# Patient Record
Sex: Male | Born: 1945
Health system: Southern US, Community
[De-identification: ages and names within clinical notes are randomized; demographics above are authoritative.]

## PROBLEM LIST (undated history)

## (undated) DIAGNOSIS — T7840XA Allergy, unspecified, initial encounter: Secondary | ICD-10-CM

## (undated) DIAGNOSIS — N400 Enlarged prostate without lower urinary tract symptoms: Secondary | ICD-10-CM

## (undated) DIAGNOSIS — K219 Gastro-esophageal reflux disease without esophagitis: Secondary | ICD-10-CM

## (undated) DIAGNOSIS — H269 Unspecified cataract: Secondary | ICD-10-CM

## (undated) DIAGNOSIS — E785 Hyperlipidemia, unspecified: Secondary | ICD-10-CM

## (undated) HISTORY — DX: Hyperlipidemia, unspecified: E78.5

## (undated) HISTORY — DX: Gastro-esophageal reflux disease without esophagitis: K21.9

## (undated) HISTORY — DX: Allergy, unspecified, initial encounter: T78.40XA

## (undated) HISTORY — DX: Unspecified cataract: H26.9

## (undated) HISTORY — DX: Benign prostatic hyperplasia without lower urinary tract symptoms: N40.0

---

## 2013-08-02 ENCOUNTER — Ambulatory Visit (INDEPENDENT_AMBULATORY_CARE_PROVIDER_SITE_OTHER): Payer: PRIVATE HEALTH INSURANCE | Admitting: Family Medicine

## 2013-08-02 ENCOUNTER — Encounter (INDEPENDENT_AMBULATORY_CARE_PROVIDER_SITE_OTHER): Payer: Self-pay

## 2013-08-02 ENCOUNTER — Encounter: Payer: Self-pay | Admitting: Family Medicine

## 2013-08-02 VITALS — BP 121/74 | HR 74 | Temp 97.5°F | Ht 66.0 in | Wt 154.0 lb

## 2013-08-02 DIAGNOSIS — E559 Vitamin D deficiency, unspecified: Secondary | ICD-10-CM

## 2013-08-02 DIAGNOSIS — Z23 Encounter for immunization: Secondary | ICD-10-CM

## 2013-08-02 DIAGNOSIS — N400 Enlarged prostate without lower urinary tract symptoms: Secondary | ICD-10-CM

## 2013-08-02 DIAGNOSIS — Z0000 Encounter for general adult medical examination without abnormal findings: Secondary | ICD-10-CM

## 2013-08-02 LAB — POCT CBC
Granulocyte percent: 75.5 %G (ref 37–80)
HCT, POC: 46.7 % (ref 43.5–53.7)
Hemoglobin: 14.9 g/dL (ref 14.1–18.1)
Lymph, poc: 1.1 (ref 0.6–3.4)
MCH, POC: 30.3 pg (ref 27–31.2)
MCHC: 31.9 g/dL (ref 31.8–35.4)
MCV: 94.9 fL (ref 80–97)
MPV: 6.8 fL (ref 0–99.8)
POC Granulocyte: 4.2 (ref 2–6.9)
POC LYMPH PERCENT: 20.2 %L (ref 10–50)
Platelet Count, POC: 229 10*3/uL (ref 142–424)
RBC: 4.9 M/uL (ref 4.69–6.13)
RDW, POC: 13 %
WBC: 5.6 10*3/uL (ref 4.6–10.2)

## 2013-08-02 MED ORDER — TAMSULOSIN HCL 0.4 MG PO CAPS: 0.4000 mg | ORAL_CAPSULE | Freq: Every day | ORAL | Status: DC

## 2013-08-02 MED ORDER — FINASTERIDE 5 MG PO TABS: 5.0000 mg | ORAL_TABLET | Freq: Every day | ORAL | Status: DC

## 2013-08-02 NOTE — Patient Instructions (Addendum)
Testicular Self-Exam A self-examination of your testicles involves looking at and feeling your testicles for abnormal lumps or swelling. Several things can cause swelling, lumps, or pain in your testicles. Some of these causes are:  Injuries.  Inflammation.  Infection.  Accumulation of fluids around your testicle (hydrocele).  Twisted testicles (testicular torsion).  Testicular cancer. Self-examination of the testicles and groin areas may be advised if you are at risk for testicular cancer. Risks for testicular cancer include:  An undescended testicle (cryptorchidism).  A history of previous testicular cancer.  A family history of testicular cancer. The testicles are easiest to examine after warm baths or showers and are more difficult to examine when you are cold. This is because the muscles attached to the testicles retract and pull them up higher or into the abdomen. Follow these steps while you are standing:  Hold your penis away from your body.  Roll one testicle between your thumb and forefinger, feeling the entire testicle.  Roll the other testicle between your thumb and forefinger, feeling the entire testicle. Feel for lumps, swelling, or discomfort. A normal testicle is egg shaped and feels firm. It is smooth and not tender. The spermatic cord can be felt as a firm spaghetti-like cord at the back of your testicle. It is also important to examine the crease between the front of your leg and your abdomen. Feel for any bumps that are tender. These could be enlarged lymph nodes.  Document Released: 11/17/2000 Document Revised: 04/13/2013 Document Reviewed: 01/31/2013 [facility] Patient Information 2014 [place], [place].   Herpes Zoster Virus Vaccine What is this medicine? HERPES ZOSTER VIRUS VACCINE (HUR peez ZOS ter vahy ruhs vak SEEN) is a vaccine. It is used to prevent shingles in adults 67 years old and over. This vaccine is not used to treat shingles or nerve pain from  shingles. This medicine may be used for other purposes; ask your health care provider or pharmacist if you have questions. COMMON BRAND NAME(S): Zostavax What should I tell my health care provider before I take this medicine? They need to know if you have any of these conditions: -cancer like leukemia or lymphoma -immune system problems or therapy -infection with fever -tuberculosis -an unusual or allergic reaction to vaccines, neomycin, gelatin, other medicines, foods, dyes, or preservatives -pregnant or trying to get pregnant -breast-feeding How should I use this medicine? This vaccine is for injection under the skin. It is given by a health care professional. Talk to your pediatrician regarding the use of this medicine in children. This medicine is not approved for use in children. Overdosage: If you think you have taken too much of this medicine contact a poison control center or emergency room at once. NOTE: This medicine is only for you. Do not share this medicine with others. What if I miss a dose? This does not apply. What may interact with this medicine? Do not take this medicine with any of the following medications: -adalimumab -anakinra -etanercept -infliximab -medicines to treat cancer -medicines that suppress your immune system This medicine may also interact with the following medications: -immunoglobulins -steroid medicines like prednisone or cortisone This list may not describe all possible interactions. Give your health care provider a list of all the medicines, herbs, non-prescription drugs, or dietary supplements you use. Also tell them if you smoke, drink alcohol, or use illegal drugs. Some items may interact with your medicine. What should I watch for while using this medicine? Visit your doctor for regular check ups. This vaccine,  like all vaccines, may not fully protect everyone. After receiving this vaccine it may be possible to pass chickenpox infection to  others. Avoid people with immune system problems, pregnant women who have not had chickenpox, and newborns of women who have not had chickenpox. Talk to your doctor for more information. What side effects may I notice from receiving this medicine? Side effects that you should report to your doctor or health care professional as soon as possible: -allergic reactions like skin rash, itching or hives, swelling of the face, lips, or tongue -breathing problems -feeling faint or lightheaded, falls -fever, flu-like symptoms -pain, tingling, numbness in the hands or feet -swelling of the ankles, feet, hands -unusually weak or tired Side effects that usually do not require medical attention (report to your doctor or health care professional if they continue or are bothersome): -aches or pains -chickenpox-like rash -diarrhea -headache -loss of appetite -nausea, vomiting -redness, pain, swelling at site where injected -runny nose This list may not describe all possible side effects. Call your doctor for medical advice about side effects. You may report side effects to FDA at 1-800-FDA-1088. Where should I keep my medicine? This drug is given in a hospital or clinic and will not be stored at home. NOTE: This sheet is a summary. It may not cover all possible information. If you have questions about this medicine, talk to your doctor, pharmacist, or health care provider.  2014, Elsevier/Gold Standard. (2010-01-28 17:43:50)

## 2013-08-02 NOTE — Progress Notes (Signed)
   Subjective:    Patient ID: Raymond Morales, male    DOB: Mar 23, 1946, 67 y.o.   MRN: [id number]  HPI This 67 y.o. male presents for evaluation of CPE.  He has hx of vitamin D deficiency and BPH.  He has been having URI sx's and is getting over a cold.  He declines colonoscopy. He has had flu shot.  He wants a shingles vaccination.   Review of Systems No chest pain, SOB, HA, dizziness, vision change, N/V, diarrhea, constipation, dysuria, urinary urgency or frequency, myalgias, arthralgias or rash.     Objective:   Physical Exam Vital signs noted  Well developed well nourished male.  HEENT - Head atraumatic Normocephalic                Eyes - PERRLA, Conjuctiva - clear Sclera- Clear EOMI                Ears - EAC's Wnl TM's Wnl Gross Hearing WNL                Nose - Nares patent                 Throat - oropharanx wnl Respiratory - Lungs CTA bilateral Cardiac - RRR S1 and S2 without murmur GI - Abdomen soft Nontender and bowel sounds active x 4 Extremities - No edema. Neuro - Grossly intact.       Assessment & Plan:  BPH (benign prostatic hyperplasia) - Plan: tamsulosin (FLOMAX) 0.4 MG CAPS capsule, finasteride (PROSCAR) 5 MG tablet  Routine general medical examination at a health care facility - Plan: POCT CBC, CMP14+EGFR, Lipid panel, PSA, total and free, Thyroid Panel With TSH, Varicella-zoster vaccine subcutaneous  Unspecified vitamin D deficiency - Plan: Vit D  25 hydroxy (rtn osteoporosis monitoring)  Deatra Canter FNP

## 2013-08-03 LAB — PSA, TOTAL AND FREE
PSA, Free Pct: 40 %
PSA, Free: 0.16 ng/mL
PSA: 0.4 ng/mL (ref 0.0–4.0)

## 2013-08-03 LAB — VITAMIN D 25 HYDROXY (VIT D DEFICIENCY, FRACTURES): Vit D, 25-Hydroxy: 27.1 ng/mL — ABNORMAL LOW (ref 30.0–100.0)

## 2013-08-03 LAB — LIPID PANEL
Chol/HDL Ratio: 3.7 ratio units (ref 0.0–5.0)
Cholesterol, Total: 205 mg/dL — ABNORMAL HIGH (ref 100–199)
HDL: 55 mg/dL (ref >39)
LDL Calculated: 127 mg/dL — ABNORMAL HIGH (ref 0–99)
Triglycerides: 114 mg/dL (ref 0–149)
VLDL Cholesterol Cal: 23 mg/dL (ref 5–40)

## 2013-08-03 LAB — CMP14+EGFR
ALT: 19 IU/L (ref 0–44)
AST: 26 IU/L (ref 0–40)
Albumin/Globulin Ratio: 1.6 (ref 1.1–2.5)
Albumin: 3.9 g/dL (ref 3.6–4.8)
Alkaline Phosphatase: 89 IU/L (ref 39–117)
BUN/Creatinine Ratio: 19 (ref 10–22)
BUN: 19 mg/dL (ref 8–27)
CO2: 22 mmol/L (ref 18–29)
Calcium: 9.1 mg/dL (ref 8.6–10.2)
Chloride: 102 mmol/L (ref 97–108)
Creatinine, Ser: 1.01 mg/dL (ref 0.76–1.27)
GFR calc Af Amer: 89 mL/min/{1.73_m2} (ref >59)
GFR calc non Af Amer: 77 mL/min/{1.73_m2} (ref >59)
Globulin, Total: 2.5 g/dL (ref 1.5–4.5)
Glucose: 98 mg/dL (ref 65–99)
Potassium: 4.3 mmol/L (ref 3.5–5.2)
Sodium: 139 mmol/L (ref 134–144)
Total Bilirubin: 0.4 mg/dL (ref 0.0–1.2)
Total Protein: 6.4 g/dL (ref 6.0–8.5)

## 2013-08-03 LAB — THYROID PANEL WITH TSH
Free Thyroxine Index: 1.3 (ref 1.2–4.9)
T3 Uptake Ratio: 25 % (ref 24–39)
T4, Total: 5 ug/dL (ref 4.5–12.0)
TSH: 1.54 u[IU]/mL (ref 0.450–4.500)

## 2013-08-16 ENCOUNTER — Telehealth: Payer: Self-pay | Admitting: *Deleted

## 2013-08-16 NOTE — Telephone Encounter (Signed)
Message copied by Baltazar Apo on Tue Aug 16, 2013 11:56 AM ------      Message from: Deatra Canter      Created: Wed Aug 03, 2013  8:29 AM       LDL cholesterol slightly elevated but HDL cholesterol is good and would recommend fish oil tablets otc.  Vitamin D is low and would recommend taking vitamin D otc 1000 iu po qd. ------

## 2013-08-17 ENCOUNTER — Encounter: Payer: Self-pay | Admitting: Family Medicine

## 2013-08-17 NOTE — Telephone Encounter (Signed)
Patient notified of results.

## 2013-09-02 ENCOUNTER — Encounter: Payer: Self-pay | Admitting: *Deleted

## 2013-11-03 ENCOUNTER — Ambulatory Visit (INDEPENDENT_AMBULATORY_CARE_PROVIDER_SITE_OTHER): Payer: PRIVATE HEALTH INSURANCE | Admitting: Family Medicine

## 2013-11-03 ENCOUNTER — Encounter: Payer: Self-pay | Admitting: Family Medicine

## 2013-11-03 VITALS — BP 142/82 | HR 87 | Temp 97.3°F | Ht 66.0 in | Wt 159.6 lb

## 2013-11-03 DIAGNOSIS — K645 Perianal venous thrombosis: Secondary | ICD-10-CM

## 2013-11-03 MED ORDER — PRAMOXINE HCL 1 % RE FOAM: 1.0000 "application " | Freq: Three times a day (TID) | RECTAL | Status: DC | PRN

## 2013-11-03 NOTE — Progress Notes (Signed)
   Subjective:    Patient ID: Raymond Morales, male    DOB: 13-Mar-1946, 68 y.o.   MRN: [id number]  HPI C/o rectal pain and mass in rectum.   Review of Systems C/o rectal pain No chest pain, SOB, HA, dizziness, vision change, N/V, diarrhea, constipation, dysuria, urinary urgency or frequency, myalgias, arthralgias or rash.     Objective:   Physical Exam  General - pleasant WDWN Cau male in nad  Rectal - Large thrombosed internal hemorrhoid.      Assessment & Plan:  Thrombosed hemorrhoids - Plan: Ambulatory referral to General Surgery, pramoxine (PROCTOFOAM) 1 % foam Recommend he see surgery asap, recommend he do sitz baths and follow up prn  Lysbeth Penner FNP

## 2013-11-04 ENCOUNTER — Ambulatory Visit (INDEPENDENT_AMBULATORY_CARE_PROVIDER_SITE_OTHER): Payer: PRIVATE HEALTH INSURANCE | Admitting: Surgery

## 2013-11-04 ENCOUNTER — Encounter (INDEPENDENT_AMBULATORY_CARE_PROVIDER_SITE_OTHER): Payer: Self-pay | Admitting: Surgery

## 2013-11-04 VITALS — BP 154/92 | HR 80 | Temp 98.2°F | Resp 16 | Ht 66.0 in | Wt 155.8 lb

## 2013-11-04 DIAGNOSIS — K645 Perianal venous thrombosis: Secondary | ICD-10-CM | POA: Insufficient documentation

## 2013-11-04 NOTE — Progress Notes (Signed)
General Surgery [facility]  Chief Complaint  Patient presents with  . Hemorrhoids    urgent office - eval thromb hems - referral from Stevan Born, [place]    HISTORY: Patient is a 68 year old male referred by his primary care provider for evaluation of a large thrombosed external hemorrhoid. This has been present for approximately 5 days. Patient has noted swelling and pain and minimal bleeding.  Patient has had no prior episodes of thrombosed hemorrhoids. He has had no prior anorectal surgery. He has never had a colonoscopy.  Past Medical History  Diagnosis Date  . BPH (benign prostatic hypertrophy)     Current Outpatient Prescriptions  Medication Sig Dispense Refill  . finasteride (PROSCAR) 5 MG tablet Take 1 tablet (5 mg total) by mouth daily.  30 tablet  11  . loratadine (CLARITIN) 10 MG tablet Take 10 mg by mouth daily.      . Multiple Vitamin (MULTIVITAMIN) LIQD Take 5 mLs by mouth daily.      . multivitamin-lutein (OCUVITE-LUTEIN) CAPS capsule Take 1 capsule by mouth daily.      . pramoxine (PROCTOFOAM) 1 % foam Place 1 application rectally 3 (three) times daily as needed for itching.  15 g  0  . tamsulosin (FLOMAX) 0.4 MG CAPS capsule Take 1 capsule (0.4 mg total) by mouth daily.  30 capsule  11   No current facility-administered medications for this visit.    No Known Allergies  Family History  Problem Relation Age of Onset  . Hypertension Mother   . Vision loss Mother   . Heart disease Father   . Thyroid disease Father     History   Social History  . Marital Status: Married    Spouse Name: N/A    Number of Children: N/A  . Years of Education: N/A   Social History Main Topics  . Smoking status: Never Smoker   . Smokeless tobacco: None  . Alcohol Use: No  . Drug Use: No  . Sexual Activity: None   Other Topics Concern  . None   Social History Narrative  . None    REVIEW OF SYSTEMS - PERTINENT  POSITIVES ONLY: Small bleeding with bowel movements. Tenderness. Swelling.  EXAM: Filed Vitals:   11/04/13 1455  BP: 154/92  Pulse: 80  Temp: 98.2 F (36.8 C)  Resp: 16    GENERAL: well-developed, well-nourished, no acute distress HEENT: normocephalic; pupils equal and reactive; sclerae clear; dentition good; mucous membranes moist NECK:  symmetric on extension; no palpable anterior or posterior cervical lymphadenopathy; no supraclavicular masses; no tenderness CHEST: clear to auscultation bilaterally without rales, rhonchi, or wheezes CARDIAC: regular rate and rhythm without significant murmur; peripheral pulses are full RECTAL: Edema and palpable thrombosis left anal verge, moderate, no ulceration EXT:  non-tender without edema; no deformity NEURO: no gross focal deficits; no sign of tremor   LABORATORY RESULTS: See [facility] ([facility]) for most recent results  PROCEDURE: Under aseptic conditions, local anesthetic is infiltrated into the left anal verge. An elliptical incision is made with a pair of scissors. A large thrombus is evacuated from and ectatic vein. Most of the vein is removed. A second thrombus is removed from more deeply within the cluster of hemorrhoids. Hemostasis is achieved with direct pressure. Procedure is well tolerated.  IMPRESSION: Thrombosed external hemorrhoid, moderate  PLAN: Usual instructions for wound care given. Patient will begin tub soaks. He has Proctofoam cream which I have instructed him to  use externally only.  Patient will return for surgical care as needed.  Earnstine Regal, MD, [facility]  Primary Care Physician: Redge Gainer, MD

## 2013-11-04 NOTE — Patient Instructions (Signed)
ANORECTAL PROCEDURES: 1.  Tub soaks 2-3 times daily in warm water (may add Epsom salts if desired) 2.  Stool softener for one month (store brand Miralax or Colace) 3.  Avoid toilet paper - use baby wipes or Tucks pads 4.  Increase water intake - 6-8 glasses daily 5.  Apply dry pad to area until drainage stops  Raymond Dorion M. Chasady Longwell, MD, FACS [facility] Office: [phone]   

## 2014-08-04 ENCOUNTER — Ambulatory Visit (INDEPENDENT_AMBULATORY_CARE_PROVIDER_SITE_OTHER): Payer: BC Managed Care – PPO | Admitting: Family Medicine

## 2014-08-04 VITALS — BP 144/86 | HR 78 | Temp 96.9°F | Ht 66.0 in | Wt 155.0 lb

## 2014-08-04 DIAGNOSIS — Z0000 Encounter for general adult medical examination without abnormal findings: Secondary | ICD-10-CM

## 2014-08-04 DIAGNOSIS — R5383 Other fatigue: Secondary | ICD-10-CM

## 2014-08-04 DIAGNOSIS — E785 Hyperlipidemia, unspecified: Secondary | ICD-10-CM

## 2014-08-04 DIAGNOSIS — N400 Enlarged prostate without lower urinary tract symptoms: Secondary | ICD-10-CM

## 2014-08-04 DIAGNOSIS — Z23 Encounter for immunization: Secondary | ICD-10-CM

## 2014-08-04 LAB — POCT CBC
Granulocyte percent: 69 %G (ref 37–80)
HCT, POC: 44.9 % (ref 43.5–53.7)
Hemoglobin: 15.1 g/dL (ref 14.1–18.1)
Lymph, poc: 1.6 (ref 0.6–3.4)
MCH, POC: 31.7 pg — AB (ref 27–31.2)
MCHC: 33.6 g/dL (ref 31.8–35.4)
MCV: 94.4 fL (ref 80–97)
MPV: 6.3 fL (ref 0–99.8)
POC Granulocyte: 5 (ref 2–6.9)
POC LYMPH PERCENT: 22.6 %L (ref 10–50)
Platelet Count, POC: 289 10*3/uL (ref 142–424)
RBC: 4.8 M/uL (ref 4.69–6.13)
RDW, POC: 12.9 %
WBC: 7.2 10*3/uL (ref 4.6–10.2)

## 2014-08-04 MED ORDER — TAMSULOSIN HCL 0.4 MG PO CAPS: 0.4000 mg | ORAL_CAPSULE | Freq: Every day | ORAL | Status: DC

## 2014-08-04 MED ORDER — FINASTERIDE 5 MG PO TABS: 5.0000 mg | ORAL_TABLET | Freq: Every day | ORAL | Status: DC

## 2014-08-04 NOTE — Progress Notes (Signed)
   Subjective:    Patient ID: Raymond Morales, male    DOB: 02/23/1946, 68 y.o.   MRN: [id number]  HPI Patient is here fore CPE.  Review of Systems  Constitutional: Negative for fever.  HENT: Negative for ear pain.   Eyes: Negative for discharge.  Respiratory: Negative for cough.   Cardiovascular: Negative for chest pain.  Gastrointestinal: Negative for abdominal distention.  Endocrine: Negative for polyuria.  Genitourinary: Negative for difficulty urinating.  Musculoskeletal: Negative for gait problem and neck pain.  Skin: Negative for color change and rash.  Neurological: Negative for speech difficulty and headaches.  Psychiatric/Behavioral: Negative for agitation.       Objective:    BP 144/86 mmHg  Pulse 78  Temp(Src) 96.9 F (36.1 C) (Oral)  Ht _0  (1.676 m)  Wt 155 lb (70.308 kg)  BMI 25.03 kg/m2 Physical Exam  Constitutional: He is oriented to person, place, and time. He appears well-developed and well-nourished.  HENT:  Head: Normocephalic and atraumatic.  Mouth/Throat: Oropharynx is clear and moist.  Eyes: Pupils are equal, round, and reactive to light.  Neck: Normal range of motion. Neck supple.  Cardiovascular: Normal rate and regular rhythm.   No murmur heard. Pulmonary/Chest: Effort normal and breath sounds normal.  Abdominal: Soft. Bowel sounds are normal. There is no tenderness.  Neurological: He is alert and oriented to person, place, and time.  Skin: Skin is warm and dry.  Psychiatric: He has a normal mood and affect.          Assessment & Plan:     ICD-9-CM ICD-10-CM   1. BPH (benign prostatic hyperplasia) 600.00 N40.0 finasteride (PROSCAR) 5 MG tablet     tamsulosin (FLOMAX) 0.4 MG CAPS capsule     PSA, total and free  2. Other fatigue 780.79 R53.83 POCT CBC     CMP14+EGFR     Thyroid Panel With TSH  3. Hyperlipemia 272.4 E78.5 CMP14+EGFR     Lipid panel     No Follow-up on file.  Lysbeth Penner FNP

## 2014-08-05 LAB — LIPID PANEL
Chol/HDL Ratio: 4.3 ratio units (ref 0.0–5.0)
Cholesterol, Total: 226 mg/dL — ABNORMAL HIGH (ref 100–199)
HDL: 53 mg/dL (ref >39)
LDL Calculated: 152 mg/dL — ABNORMAL HIGH (ref 0–99)
Triglycerides: 105 mg/dL (ref 0–149)
VLDL Cholesterol Cal: 21 mg/dL (ref 5–40)

## 2014-08-05 LAB — CMP14+EGFR
ALT: 20 IU/L (ref 0–44)
AST: 25 IU/L (ref 0–40)
Albumin/Globulin Ratio: 1.8 (ref 1.1–2.5)
Albumin: 4.2 g/dL (ref 3.6–4.8)
Alkaline Phosphatase: 95 IU/L (ref 39–117)
BUN/Creatinine Ratio: 15 (ref 10–22)
BUN: 16 mg/dL (ref 8–27)
CO2: 27 mmol/L (ref 18–29)
Calcium: 9.5 mg/dL (ref 8.6–10.2)
Chloride: 102 mmol/L (ref 97–108)
Creatinine, Ser: 1.08 mg/dL (ref 0.76–1.27)
GFR calc Af Amer: 81 mL/min/{1.73_m2} (ref >59)
GFR calc non Af Amer: 70 mL/min/{1.73_m2} (ref >59)
Globulin, Total: 2.4 g/dL (ref 1.5–4.5)
Glucose: 105 mg/dL — ABNORMAL HIGH (ref 65–99)
Potassium: 4.5 mmol/L (ref 3.5–5.2)
Sodium: 141 mmol/L (ref 134–144)
Total Bilirubin: 0.9 mg/dL (ref 0.0–1.2)
Total Protein: 6.6 g/dL (ref 6.0–8.5)

## 2014-08-05 LAB — THYROID PANEL WITH TSH
Free Thyroxine Index: 2 (ref 1.2–4.9)
T3 Uptake Ratio: 26 % (ref 24–39)
T4, Total: 7.8 ug/dL (ref 4.5–12.0)
TSH: 1.54 u[IU]/mL (ref 0.450–4.500)

## 2014-08-05 LAB — PSA, TOTAL AND FREE
PSA, Free Pct: 47.5 %
PSA, Free: 0.19 ng/mL
PSA: 0.4 ng/mL (ref 0.0–4.0)

## 2014-08-07 ENCOUNTER — Other Ambulatory Visit: Payer: Self-pay | Admitting: Family Medicine

## 2015-05-30 DIAGNOSIS — Z23 Encounter for immunization: Secondary | ICD-10-CM | POA: Diagnosis not present

## 2015-06-25 DIAGNOSIS — T1511XA Foreign body in conjunctival sac, right eye, initial encounter: Secondary | ICD-10-CM | POA: Diagnosis not present

## 2015-06-25 DIAGNOSIS — H5711 Ocular pain, right eye: Secondary | ICD-10-CM | POA: Diagnosis not present

## 2015-08-06 ENCOUNTER — Encounter: Payer: Self-pay | Admitting: Family Medicine

## 2015-08-06 ENCOUNTER — Ambulatory Visit (INDEPENDENT_AMBULATORY_CARE_PROVIDER_SITE_OTHER): Payer: Medicare Other | Admitting: Family Medicine

## 2015-08-06 ENCOUNTER — Other Ambulatory Visit: Payer: Medicare Other

## 2015-08-06 VITALS — BP 136/88 | HR 81 | Temp 97.1°F | Ht 66.0 in | Wt 151.2 lb

## 2015-08-06 DIAGNOSIS — N400 Enlarged prostate without lower urinary tract symptoms: Secondary | ICD-10-CM | POA: Insufficient documentation

## 2015-08-06 DIAGNOSIS — Z1212 Encounter for screening for malignant neoplasm of rectum: Secondary | ICD-10-CM | POA: Diagnosis not present

## 2015-08-06 DIAGNOSIS — E785 Hyperlipidemia, unspecified: Secondary | ICD-10-CM | POA: Diagnosis not present

## 2015-08-06 DIAGNOSIS — Z0000 Encounter for general adult medical examination without abnormal findings: Secondary | ICD-10-CM | POA: Insufficient documentation

## 2015-08-06 DIAGNOSIS — E559 Vitamin D deficiency, unspecified: Secondary | ICD-10-CM | POA: Diagnosis not present

## 2015-08-06 DIAGNOSIS — Z23 Encounter for immunization: Secondary | ICD-10-CM | POA: Diagnosis not present

## 2015-08-06 MED ORDER — TAMSULOSIN HCL 0.4 MG PO CAPS: 0.4000 mg | ORAL_CAPSULE | Freq: Every day | ORAL | Status: DC

## 2015-08-06 MED ORDER — FINASTERIDE 5 MG PO TABS: 5.0000 mg | ORAL_TABLET | Freq: Every day | ORAL | Status: DC

## 2015-08-06 NOTE — Patient Instructions (Signed)
Great to meet you!  Bring the stool blood test back within 1 week   We will call with your labs  Consider cholesterol medicine, they are called statins, we will let you know how much you changed your numbers.

## 2015-08-06 NOTE — Progress Notes (Signed)
HPI  Patient presents today here today to discuss BPH in for annual physical.  BPH Doing well on Flomax and Proscar, no complaints Urinary symptoms reasonable.  Feeling well overall Denies chest pain, palpitations, leg edema, other concerns.  Hyperlipidemia Watching his diet carefully trying to cut down on meat and fried fatty foods. He is active but has no formal exercise regimen.  She like to avoid medications if possible  PMH: Smoking status noted Past medical, surgical, social, family history reviewed and updated in EMR ROS: Per HPI  Objective: BP 136/88 mmHg  Pulse 81  Temp(Src) 97.1 F (36.2 C) (Oral)  Ht 5' 6" (1.676 m)  Wt 151 lb 3.2 oz (68.584 kg)  BMI 24.42 kg/m2 Gen: NAD, alert, cooperative with exam HEENT: NCAT CV: RRR, good S1/S2, no murmur, no carotid bruits Resp: CTABL, no wheezes, non-labored Abd: SNTND, BS present, no guarding or organomegaly Ext: No edema, warm Neuro: Alert and oriented, 2+ patellar tendon reflexes, normal gait  Assessment and plan:  # Annual physical Normal physical Fasting labs  # BPH Checking PSA, refill Proscar and tamsulosin  # Hyperlipidemia Trying to avoid medications, he is aggressively managing his diet Rechecking lipids, fasting Consider statins, wait for labs  # Healthcare maintenance Hepatitis C antibody checked Pneumovax given today Flu up-to-date Refuses colonoscopy, I've given him an FOBT card  Vitamin D deficiency Continue vitamin D Not ordered level as epic stopped it and said it wouldn't be paid for.    Orders Placed This Encounter  Procedures  . CMP14+EGFR  . Lipid panel  . PSA, total and free  . Hepatitis C antibody     Laroy Apple, MD [facility] 08/06/2015, 10:23 AM

## 2015-08-07 LAB — CMP14+EGFR
ALK PHOS: 97 IU/L (ref 39–117)
ALT: 16 IU/L (ref 0–44)
AST: 24 IU/L (ref 0–40)
Albumin/Globulin Ratio: 1.8 (ref 1.1–2.5)
Albumin: 4.2 g/dL (ref 3.6–4.8)
BUN/Creatinine Ratio: 16 (ref 10–22)
BUN: 16 mg/dL (ref 8–27)
Bilirubin Total: 0.8 mg/dL (ref 0.0–1.2)
CO2: 22 mmol/L (ref 18–29)
CREATININE: 0.99 mg/dL (ref 0.76–1.27)
Calcium: 9.5 mg/dL (ref 8.6–10.2)
Chloride: 101 mmol/L (ref 96–106)
GFR calc non Af Amer: 77 mL/min/{1.73_m2} (ref >59)
GFR, EST AFRICAN AMERICAN: 89 mL/min/{1.73_m2} (ref >59)
GLUCOSE: 103 mg/dL — AB (ref 65–99)
Globulin, Total: 2.4 g/dL (ref 1.5–4.5)
Potassium: 4.2 mmol/L (ref 3.5–5.2)
Sodium: 140 mmol/L (ref 134–144)
TOTAL PROTEIN: 6.6 g/dL (ref 6.0–8.5)

## 2015-08-07 LAB — PSA, TOTAL AND FREE
PSA FREE: 0.17 ng/mL
PSA, Free Pct: 42.5 %
Prostate Specific Ag, Serum: 0.4 ng/mL (ref 0.0–4.0)

## 2015-08-07 LAB — LIPID PANEL
CHOLESTEROL TOTAL: 206 mg/dL — AB (ref 100–199)
Chol/HDL Ratio: 3.5 ratio units (ref 0.0–5.0)
HDL: 59 mg/dL (ref >39)
LDL CALC: 130 mg/dL — AB (ref 0–99)
Triglycerides: 83 mg/dL (ref 0–149)
VLDL CHOLESTEROL CAL: 17 mg/dL (ref 5–40)

## 2015-08-07 LAB — HEPATITIS C ANTIBODY: Hep C Virus Ab: <0.1 s/co ratio (ref 0.0–0.9)

## 2015-08-08 LAB — FECAL OCCULT BLOOD, IMMUNOCHEMICAL: Fecal Occult Bld: NEGATIVE

## 2015-08-09 ENCOUNTER — Other Ambulatory Visit: Payer: Self-pay | Admitting: Family Medicine

## 2015-08-09 MED ORDER — ATORVASTATIN CALCIUM 10 MG PO TABS: 10.0000 mg | ORAL_TABLET | Freq: Every day | ORAL | Status: DC

## 2015-11-09 ENCOUNTER — Ambulatory Visit (INDEPENDENT_AMBULATORY_CARE_PROVIDER_SITE_OTHER): Payer: Medicare Other | Admitting: Family Medicine

## 2015-11-09 ENCOUNTER — Encounter: Payer: Self-pay | Admitting: Family Medicine

## 2015-11-09 VITALS — BP 147/87 | HR 97 | Temp 97.1°F | Ht 66.0 in | Wt 153.6 lb

## 2015-11-09 DIAGNOSIS — E785 Hyperlipidemia, unspecified: Secondary | ICD-10-CM | POA: Diagnosis not present

## 2015-11-09 DIAGNOSIS — N400 Enlarged prostate without lower urinary tract symptoms: Secondary | ICD-10-CM

## 2015-11-09 DIAGNOSIS — Z0000 Encounter for general adult medical examination without abnormal findings: Secondary | ICD-10-CM

## 2015-11-09 NOTE — Patient Instructions (Signed)
Great to see you!  Come back in 6 months  We will keep an eye on your blood pressure, if it creeps up over 150/90 we can start a low dose medication

## 2015-11-09 NOTE — Progress Notes (Signed)
   HPI  Patient presents today for follow-up hyperlipidemia BPH, and healthcare maintenance  Hyperlipidemia Tolerating Lipitor well, had about 5 days of myalgias after 3 weeks of treatment, this is resolved Watching his diet intermittently.  Healthcare maintenance Does not want a colonoscopy- discussed this.  BPH Doing very well with Proscar and Flomax  PMH: Smoking status noted ROS: Per HPI  Objective: BP 147/87 mmHg  Pulse 97  Temp(Src) 97.1 F (36.2 C) (Oral)  Ht '5\' 6"'$  (1.676 m)  Wt 153 lb 9.6 oz (69.673 kg)  BMI 24.80 kg/m2 Gen: NAD, alert, cooperative with exam HEENT: NCAT CV: RRR, good S1/S2, no murmur Resp: CTABL, no wheezes, non-labored Ext: No edema, warm Neuro: Alert and oriented, No gross deficits  Assessment and plan:  # Hyperlipidemia Tolerating statin well Repeating lipid panel and CMP   # Healthcare maintenance Patient refusing colonoscopy, FOBT is negative  # BPH Doing well with Proscar and Flomax, no complaints   Orders Placed This Encounter  Procedures  . Lipid panel  . CMP14+EGFR  . CBC with Differential     Laroy Apple, MD [facility] 11/09/2015, 8:05 AM

## 2015-11-10 LAB — CMP14+EGFR
ALBUMIN: 3.7 g/dL (ref 3.6–4.8)
ALK PHOS: 86 IU/L (ref 39–117)
ALT: 30 IU/L (ref 0–44)
AST: 25 IU/L (ref 0–40)
Albumin/Globulin Ratio: 1.3 (ref 1.2–2.2)
BILIRUBIN TOTAL: 0.9 mg/dL (ref 0.0–1.2)
BUN / CREAT RATIO: 20 (ref 10–22)
BUN: 20 mg/dL (ref 8–27)
CHLORIDE: 102 mmol/L (ref 96–106)
CO2: 24 mmol/L (ref 18–29)
Calcium: 9.1 mg/dL (ref 8.6–10.2)
Creatinine, Ser: 0.98 mg/dL (ref 0.76–1.27)
GFR calc Af Amer: 91 mL/min/{1.73_m2} (ref >59)
GFR calc non Af Amer: 78 mL/min/{1.73_m2} (ref >59)
GLUCOSE: 110 mg/dL — AB (ref 65–99)
Globulin, Total: 2.8 g/dL (ref 1.5–4.5)
Potassium: 4.2 mmol/L (ref 3.5–5.2)
SODIUM: 141 mmol/L (ref 134–144)
Total Protein: 6.5 g/dL (ref 6.0–8.5)

## 2015-11-10 LAB — CBC WITH DIFFERENTIAL/PLATELET
BASOS ABS: 0 10*3/uL (ref 0.0–0.2)
Basos: 1 %
EOS (ABSOLUTE): 0.1 10*3/uL (ref 0.0–0.4)
Eos: 1 %
Hematocrit: 42.5 % (ref 37.5–51.0)
Hemoglobin: 14.5 g/dL (ref 12.6–17.7)
Immature Grans (Abs): 0 10*3/uL (ref 0.0–0.1)
Immature Granulocytes: 0 %
LYMPHS ABS: 1.7 10*3/uL (ref 0.7–3.1)
Lymphs: 20 %
MCH: 32 pg (ref 26.6–33.0)
MCHC: 34.1 g/dL (ref 31.5–35.7)
MCV: 94 fL (ref 79–97)
MONOCYTES: 10 %
MONOS ABS: 0.8 10*3/uL (ref 0.1–0.9)
Neutrophils Absolute: 5.7 10*3/uL (ref 1.4–7.0)
Neutrophils: 68 %
Platelets: 310 10*3/uL (ref 150–379)
RBC: 4.53 x10E6/uL (ref 4.14–5.80)
RDW: 12.8 % (ref 12.3–15.4)
WBC: 8.4 10*3/uL (ref 3.4–10.8)

## 2015-11-10 LAB — LIPID PANEL
Chol/HDL Ratio: 3.3 ratio units (ref 0.0–5.0)
Cholesterol, Total: 149 mg/dL (ref 100–199)
HDL: 45 mg/dL (ref >39)
LDL Calculated: 78 mg/dL (ref 0–99)
Triglycerides: 128 mg/dL (ref 0–149)
VLDL CHOLESTEROL CAL: 26 mg/dL (ref 5–40)

## 2015-12-31 DIAGNOSIS — H538 Other visual disturbances: Secondary | ICD-10-CM | POA: Diagnosis not present

## 2015-12-31 DIAGNOSIS — H52209 Unspecified astigmatism, unspecified eye: Secondary | ICD-10-CM | POA: Diagnosis not present

## 2015-12-31 DIAGNOSIS — H43399 Other vitreous opacities, unspecified eye: Secondary | ICD-10-CM | POA: Diagnosis not present

## 2015-12-31 DIAGNOSIS — H25093 Other age-related incipient cataract, bilateral: Secondary | ICD-10-CM | POA: Diagnosis not present

## 2016-05-12 ENCOUNTER — Encounter: Payer: Self-pay | Admitting: Family Medicine

## 2016-05-12 ENCOUNTER — Ambulatory Visit (INDEPENDENT_AMBULATORY_CARE_PROVIDER_SITE_OTHER): Payer: Medicare Other | Admitting: Family Medicine

## 2016-05-12 VITALS — BP 132/81 | HR 82 | Temp 96.9°F | Ht 66.0 in | Wt 157.8 lb

## 2016-05-12 DIAGNOSIS — Z0000 Encounter for general adult medical examination without abnormal findings: Secondary | ICD-10-CM

## 2016-05-12 DIAGNOSIS — E785 Hyperlipidemia, unspecified: Secondary | ICD-10-CM

## 2016-05-12 DIAGNOSIS — Z23 Encounter for immunization: Secondary | ICD-10-CM | POA: Diagnosis not present

## 2016-05-12 DIAGNOSIS — N400 Enlarged prostate without lower urinary tract symptoms: Secondary | ICD-10-CM | POA: Diagnosis not present

## 2016-05-12 MED ORDER — TAMSULOSIN HCL 0.4 MG PO CAPS: 0.4000 mg | ORAL_CAPSULE | Freq: Every day | ORAL | 3 refills | Status: DC

## 2016-05-12 MED ORDER — ATORVASTATIN CALCIUM 10 MG PO TABS: 10.0000 mg | ORAL_TABLET | Freq: Every day | ORAL | 3 refills | Status: DC

## 2016-05-12 MED ORDER — FINASTERIDE 5 MG PO TABS: 5.0000 mg | ORAL_TABLET | Freq: Every day | ORAL | 3 refills | Status: DC

## 2016-05-12 NOTE — Patient Instructions (Signed)
Great to see you!  Lets see you again in 6 months  We will send your labs on mychart or call within 1 week

## 2016-05-12 NOTE — Progress Notes (Signed)
   HPI  Patient presents today here for follow-up hyperlipidemia, BPH, and for a flu shot.  She has history of vitamin D deficiency, not taking vitamin D anymore.  Hyperlipidemia Patient has liberated his diet, not exercising routinely but is very active. Good medication compliance.  BPH No complaints of uncontrolled symptoms, no medication side effects, happy with regimen.  Patient would like a flu vaccine today  PMH: Smoking status noted ROS: Per HPI  Objective: BP 132/81   Pulse 82   Temp (!) 96.9 F (36.1 C) (Oral)   Ht _0  (1.676 m)   Wt 157 lb 12.8 oz (71.6 kg)   BMI 25.47 kg/m  Gen: NAD, alert, cooperative with exam HEENT: NCAT CV: RRR, good S1/S2, no murmur Resp: CTABL, no wheezes, non-labored Ext: No edema, warm Neuro: Alert and oriented, No gross deficits  Assessment and plan:  # Hyperlipidemia Likely well controlled, however patient has liberated his diet a bit Continue current dose of Lipitor, LDL goal of 100  # BPH Symptoms well controlled Continue Proscar and Flomax  # Healthcare maintenance Flu shot given today Discuss colonoscopy - pt is unwilling    Orders Placed This Encounter  Procedures  . CMP14+EGFR  . Lipid panel    Meds ordered this encounter  Medications  . tamsulosin (FLOMAX) 0.4 MG CAPS capsule    Sig: Take 1 capsule (0.4 mg total) by mouth daily.    Dispense:  90 capsule    Refill:  3  . finasteride (PROSCAR) 5 MG tablet    Sig: Take 1 tablet (5 mg total) by mouth daily.    Dispense:  90 tablet    Refill:  3  . atorvastatin (LIPITOR) 10 MG tablet    Sig: Take 1 tablet (10 mg total) by mouth daily.    Dispense:  90 tablet    Refill:  [place], MD [place] 05/12/2016, 8:07 AM

## 2016-05-13 LAB — LIPID PANEL
CHOLESTEROL TOTAL: 149 mg/dL (ref 100–199)
Chol/HDL Ratio: 2.9 ratio units (ref 0.0–5.0)
HDL: 51 mg/dL (ref >39)
LDL Calculated: 76 mg/dL (ref 0–99)
Triglycerides: 112 mg/dL (ref 0–149)
VLDL CHOLESTEROL CAL: 22 mg/dL (ref 5–40)

## 2016-05-13 LAB — CMP14+EGFR
ALK PHOS: 93 IU/L (ref 39–117)
ALT: 19 IU/L (ref 0–44)
AST: 24 IU/L (ref 0–40)
Albumin/Globulin Ratio: 1.7 (ref 1.2–2.2)
Albumin: 4 g/dL (ref 3.6–4.8)
BILIRUBIN TOTAL: 1 mg/dL (ref 0.0–1.2)
BUN / CREAT RATIO: 19 (ref 10–24)
BUN: 18 mg/dL (ref 8–27)
CHLORIDE: 101 mmol/L (ref 96–106)
CO2: 23 mmol/L (ref 18–29)
Calcium: 8.9 mg/dL (ref 8.6–10.2)
Creatinine, Ser: 0.95 mg/dL (ref 0.76–1.27)
GFR calc Af Amer: 94 mL/min/{1.73_m2} (ref >59)
GFR calc non Af Amer: 81 mL/min/{1.73_m2} (ref >59)
GLUCOSE: 109 mg/dL — AB (ref 65–99)
Globulin, Total: 2.3 g/dL (ref 1.5–4.5)
Potassium: 4.1 mmol/L (ref 3.5–5.2)
Sodium: 139 mmol/L (ref 134–144)
Total Protein: 6.3 g/dL (ref 6.0–8.5)

## 2016-05-27 ENCOUNTER — Telehealth: Payer: Self-pay | Admitting: Family Medicine

## 2016-05-27 ENCOUNTER — Other Ambulatory Visit: Payer: Self-pay | Admitting: *Deleted

## 2016-05-27 DIAGNOSIS — R7309 Other abnormal glucose: Secondary | ICD-10-CM

## 2016-05-27 NOTE — Telephone Encounter (Signed)
Pt notified lab was not able to add A1c Pt will come in for lab Verbalizes understanding

## 2016-05-28 ENCOUNTER — Other Ambulatory Visit (INDEPENDENT_AMBULATORY_CARE_PROVIDER_SITE_OTHER): Payer: Medicare Other

## 2016-05-28 DIAGNOSIS — R7309 Other abnormal glucose: Secondary | ICD-10-CM | POA: Diagnosis not present

## 2016-05-28 LAB — BAYER DCA HB A1C WAIVED: HB A1C (BAYER DCA - WAIVED): 5.3 % (ref <7.0)

## 2016-10-28 DIAGNOSIS — H539 Unspecified visual disturbance: Secondary | ICD-10-CM | POA: Diagnosis not present

## 2016-10-28 DIAGNOSIS — H02825 Cysts of left lower eyelid: Secondary | ICD-10-CM | POA: Diagnosis not present

## 2016-11-13 ENCOUNTER — Encounter: Payer: Self-pay | Admitting: Family Medicine

## 2016-11-13 ENCOUNTER — Ambulatory Visit (INDEPENDENT_AMBULATORY_CARE_PROVIDER_SITE_OTHER): Payer: Medicare Other | Admitting: Family Medicine

## 2016-11-13 VITALS — BP 133/75 | HR 81 | Temp 96.6°F | Ht 66.0 in | Wt 164.8 lb

## 2016-11-13 DIAGNOSIS — J301 Allergic rhinitis due to pollen: Secondary | ICD-10-CM

## 2016-11-13 DIAGNOSIS — E785 Hyperlipidemia, unspecified: Secondary | ICD-10-CM | POA: Diagnosis not present

## 2016-11-13 DIAGNOSIS — J309 Allergic rhinitis, unspecified: Secondary | ICD-10-CM | POA: Insufficient documentation

## 2016-11-13 DIAGNOSIS — N400 Enlarged prostate without lower urinary tract symptoms: Secondary | ICD-10-CM

## 2016-11-13 NOTE — Progress Notes (Signed)
   HPI  Patient presents today for follow-up for chronic medical conditions.  Hyperlipidemia Tolerating Lipitor well, has been on this about one year now. Occasionally has 1-3 days of mild muscle aches, he states this is not a problem and it usually self resolve the problem. He has limited his diet somewhat.  The pH Doing well with Flomax.  Allergic rhinitis Taking Claritin as needed, has several questions about dementia or confusion with Claritin, also possibility exacerbating cataracts with nasal steroids.  PMH: Smoking status noted ROS: Per HPI  Objective: BP 133/75   Pulse 81   Temp (!) 96.6 F (35.9 C) (Oral)   Ht _0  (1.676 m)   Wt 164 lb 12.8 oz (74.8 kg)   BMI 26.60 kg/m  Gen: NAD, alert, cooperative with exam HEENT: NCAT CV: RRR, good S1/S2, no murmur Resp: CTABL, no wheezes, non-labored Ext: No edema, warm Neuro: Alert and oriented, No gross deficits  Assessment and plan:  # Hyperlipidemia Repeat labs today, mild myalgias with statins Continue Lipitor. CMP as well  # BPH Symptoms stable Has noticed worsening with Benadryl previously Continue Flomax PSA annually  # Allergic rhinitis Claritin okay, discussed concerns about anticholinergic effects and confusion. Agree with avoiding nasal steroids if possible with some possibility of worsening cataracts or worsening intraocular pressure. Nasal saline instead  HCM - Discussed colonoscopy Patient does not want to do a colonoscopy, he had an FOBT in 2016 negative, he wants to avoid this for now as well.     Orders Placed This Encounter  Procedures  . CBC with Differential/Platelet  . CMP14+EGFR  . Lipid panel  . [place], MD [facility] 11/13/2016, 8:22 AM

## 2016-11-13 NOTE — Patient Instructions (Signed)
Great to see you!  Come back in 6 months unless you need us sooner.    

## 2016-11-14 LAB — CBC WITH DIFFERENTIAL/PLATELET
BASOS: 1 %
Basophils Absolute: 0 10*3/uL (ref 0.0–0.2)
EOS (ABSOLUTE): 0.2 10*3/uL (ref 0.0–0.4)
EOS: 2 %
HEMATOCRIT: 44.3 % (ref 37.5–51.0)
Hemoglobin: 14.6 g/dL (ref 13.0–17.7)
IMMATURE GRANULOCYTES: 0 %
Immature Grans (Abs): 0 10*3/uL (ref 0.0–0.1)
LYMPHS: 20 %
Lymphocytes Absolute: 1.6 10*3/uL (ref 0.7–3.1)
MCH: 31.6 pg (ref 26.6–33.0)
MCHC: 33 g/dL (ref 31.5–35.7)
MCV: 96 fL (ref 79–97)
MONOCYTES: 12 %
MONOS ABS: 1 10*3/uL — AB (ref 0.1–0.9)
NEUTROS PCT: 65 %
Neutrophils Absolute: 5.4 10*3/uL (ref 1.4–7.0)
PLATELETS: 265 10*3/uL (ref 150–379)
RBC: 4.62 x10E6/uL (ref 4.14–5.80)
RDW: 13 % (ref 12.3–15.4)
WBC: 8.2 10*3/uL (ref 3.4–10.8)

## 2016-11-14 LAB — CMP14+EGFR
A/G RATIO: 1.5 (ref 1.2–2.2)
ALT: 23 IU/L (ref 0–44)
AST: 25 IU/L (ref 0–40)
Albumin: 3.9 g/dL (ref 3.5–4.8)
Alkaline Phosphatase: 97 IU/L (ref 39–117)
BUN/Creatinine Ratio: 15 (ref 10–24)
BUN: 16 mg/dL (ref 8–27)
Bilirubin Total: 0.8 mg/dL (ref 0.0–1.2)
CALCIUM: 9.1 mg/dL (ref 8.6–10.2)
CO2: 25 mmol/L (ref 18–29)
Chloride: 100 mmol/L (ref 96–106)
Creatinine, Ser: 1.09 mg/dL (ref 0.76–1.27)
GFR calc Af Amer: 79 mL/min/{1.73_m2} (ref >59)
GFR, EST NON AFRICAN AMERICAN: 68 mL/min/{1.73_m2} (ref >59)
GLOBULIN, TOTAL: 2.6 g/dL (ref 1.5–4.5)
Glucose: 101 mg/dL — ABNORMAL HIGH (ref 65–99)
POTASSIUM: 4.1 mmol/L (ref 3.5–5.2)
SODIUM: 139 mmol/L (ref 134–144)
Total Protein: 6.5 g/dL (ref 6.0–8.5)

## 2016-11-14 LAB — LIPID PANEL
CHOLESTEROL TOTAL: 158 mg/dL (ref 100–199)
Chol/HDL Ratio: 3.2 ratio units (ref 0.0–5.0)
HDL: 50 mg/dL (ref >39)
LDL Calculated: 83 mg/dL (ref 0–99)
TRIGLYCERIDES: 124 mg/dL (ref 0–149)
VLDL Cholesterol Cal: 25 mg/dL (ref 5–40)

## 2016-11-14 LAB — PSA: Prostate Specific Ag, Serum: 0.4 ng/mL (ref 0.0–4.0)

## 2016-11-14 NOTE — Progress Notes (Signed)
Patient aware.

## 2016-11-25 ENCOUNTER — Ambulatory Visit (INDEPENDENT_AMBULATORY_CARE_PROVIDER_SITE_OTHER): Payer: Medicare Other | Admitting: *Deleted

## 2016-11-25 VITALS — BP 122/71 | HR 69 | Temp 97.7°F | Ht 66.0 in | Wt 164.0 lb

## 2016-11-25 DIAGNOSIS — Z0000 Encounter for general adult medical examination without abnormal findings: Secondary | ICD-10-CM | POA: Diagnosis not present

## 2016-11-25 NOTE — Patient Instructions (Signed)
  Raymond Morales , Thank you for taking time to come for your Medicare Wellness Visit. I appreciate your ongoing commitment to your health goals. Please review the following plan we discussed and let me know if I can assist you in the future.   These are the goals we discussed: Goals    . Exercise 3x per week (30 min per time)       This is a list of the screening recommended for you and due dates:  Health Maintenance  Topic Date Due  . Colon Cancer Screening  11/26/2019*  . Flu Shot  03/25/2017  . Tetanus Vaccine  04/25/2021  .  Hepatitis C: One time screening is recommended by Center for Disease Control  (CDC) for  adults born from [birth year] through 1965.   Completed  . Pneumonia vaccines  Completed  *Topic was postponed. The date shown is not the original due date.   Keep follow up appts with Dr Wendi Snipes Review the advanced directives form

## 2016-11-25 NOTE — Progress Notes (Addendum)
Subjective:   Raymond Morales is a 71 y.o. male who presents for an Initial Medicare Annual Wellness Visit.  Patient here today for Medicare Annual wellness visit. He is a 71 year old male who enjoys using his tractor, yard work and gardening. He is married and lives in his home with his wife and mentally handicap adult daughter. 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 He also worked for [employer] and this is where he retired. He attends church on Sundays where he plays the piano. He states he does not have a regular exercise routine, but does eat three semi- healthy meals a day. He is aware of fall / tripping hazards in the home and only has basement stairs. He does have a small cat that is in and out of the house. He states that today, his health is about the same as a year ago.      Objective:    Today's Vitals   11/25/16 0829  BP: 122/71  Pulse: 69  Temp: 97.7 F (36.5 C)  TempSrc: Oral  Weight: 164 lb (74.4 kg)  Height: 5\' 6"  (1.676 m)   Body mass index is 26.47 kg/m.  Current Medications (verified) Outpatient Encounter Prescriptions as of 11/25/2016  Medication Sig  . atorvastatin (LIPITOR) 10 MG tablet Take 1 tablet (10 mg total) by mouth daily.  . finasteride (PROSCAR) 5 MG tablet Take 1 tablet (5 mg total) by mouth daily.  Marland Kitchen loratadine (CLARITIN) 10 MG tablet Take 10 mg by mouth daily.  . multivitamin-lutein (OCUVITE-LUTEIN) CAPS capsule Take 1 capsule by mouth daily.  . tamsulosin (FLOMAX) 0.4 MG CAPS capsule Take 1 capsule (0.4 mg total) by mouth daily.  . [DISCONTINUED] cholecalciferol (VITAMIN D) 1000 UNITS tablet Take 1,000 Units by mouth daily.   No facility-administered encounter medications on file as of 11/25/2016.     Allergies (verified) Patient has no known allergies.   History: Past Medical History:  Diagnosis Date  . Allergy    seasonal  . BPH (benign prostatic hypertrophy)   . Hyperlipidemia    History reviewed.  No pertinent surgical history. Family History  Problem Relation Age of Onset  . Hypertension Mother   . Vision loss Mother   . Atrial fibrillation Mother   . Heart disease Father   . Thyroid disease Father   . Hypertension Sister   . Atrial fibrillation Sister    Social History   Occupational History  . Not on file.   Social History Main Topics  . Smoking status: Never Smoker  . Smokeless tobacco: Never Used  . Alcohol use No  . Drug use: No  . Sexual activity: No   Tobacco Counseling Counseling given: Not Answered Patient does not smoke or chew tobacco.  Activities of Daily Living In your present state of health, do you have any difficulty performing the following activities: 11/25/2016  Hearing? Y  Vision? Y  Difficulty concentrating or making decisions? Y  Walking or climbing stairs? N  Dressing or bathing? N  Doing errands, shopping? N  Some recent data might be hidden   He states that he has always had difficulty with high pitch noises and he does wear prescription glasses everyday. Occasionally he has trouble with making decisions.   Immunizations and Health Maintenance Immunization History  Administered Date(s) Administered  . Influenza,inj,Quad PF,36+ Mos 05/12/2016  . Influenza-Unspecified 05/28/2015  . Pneumococcal Conjugate-13 08/04/2014  . Pneumococcal Polysaccharide-23 08/06/2015  . Zoster 08/02/2013  There are no preventive care reminders to display for this patient.  Patient Care Team: Timmothy Euler, MD as PCP - General (Family Medicine)  Indicate any recent Medical Services you may have received from other than [facility] providers in the past year (date may be approximate).    Assessment:   This is a routine wellness examination for Raymond Morales.   Hearing/Vision screen No exam data present Wears glasses and only has trouble with hearing high pitch noises.  Dietary issues and exercise activities discussed:    Goals    . Exercise 3x per week (30  min per time)      Depression Screen PHQ 2/9 Scores 11/25/2016 11/13/2016 05/12/2016 11/09/2015  PHQ - 2 Score 1 0 0 0    Fall Risk Fall Risk  11/25/2016 11/13/2016 05/12/2016 11/09/2015 08/04/2014  Falls in the past year? No No No No No    Cognitive Function: MMSE - Mini Mental State Exam 11/25/2016  Orientation to time 5  Orientation to Place 5  Registration 3  Attention/ Calculation 5  Recall 2  Language- name 2 objects 2  Language- repeat 1  Language- follow 3 step command 3  Language- read & follow direction 1  Write a sentence 1  Copy design 1  Total score 29   MMSE score of 29 out of 30.      Screening Tests Health Maintenance  Topic Date Due  . COLONOSCOPY  11/26/2019 (Originally 05/26/1996)  . INFLUENZA VACCINE  03/25/2017  . TETANUS/TDAP  04/25/2021  . Hepatitis C Screening  Completed  . PNA vac Low Risk Adult  Completed        Plan:     Follow up with PCP, Dr Wendi Snipes recommend patient get an EKG and CXR at the next OV. The patient has never had a colonoscopy, but refuses at this time.   During the course of the visit Raymond Morales was educated and counseled about the following appropriate screening and preventive services:   Vaccines to include Pneumoccal, Influenza, Hepatitis B, Td, Zostavax, HCV  Electrocardiogram  Colorectal cancer screening  Cardiovascular disease screening  Diabetes screening  Glaucoma screening  Nutrition counseling  Prostate cancer screening  Smoking cessation counseling  Patient Instructions (the written plan) were given to the patient.   Raymond Morales, Raymond Proud, LPN   10/25/231    I have reviewed and agree with the above AWV documentation.   Raymond Apple, MD [facility] 11/25/2016, 9:52 AM

## 2017-01-01 DIAGNOSIS — H538 Other visual disturbances: Secondary | ICD-10-CM | POA: Diagnosis not present

## 2017-01-01 DIAGNOSIS — H25093 Other age-related incipient cataract, bilateral: Secondary | ICD-10-CM | POA: Diagnosis not present

## 2017-01-01 DIAGNOSIS — H5213 Myopia, bilateral: Secondary | ICD-10-CM | POA: Diagnosis not present

## 2017-01-01 DIAGNOSIS — H43393 Other vitreous opacities, bilateral: Secondary | ICD-10-CM | POA: Diagnosis not present

## 2017-01-21 DIAGNOSIS — H43813 Vitreous degeneration, bilateral: Secondary | ICD-10-CM | POA: Diagnosis not present

## 2017-02-12 DIAGNOSIS — H538 Other visual disturbances: Secondary | ICD-10-CM | POA: Diagnosis not present

## 2017-02-12 DIAGNOSIS — H4389 Other disorders of vitreous body: Secondary | ICD-10-CM | POA: Diagnosis not present

## 2017-03-03 DIAGNOSIS — H538 Other visual disturbances: Secondary | ICD-10-CM | POA: Diagnosis not present

## 2017-05-18 ENCOUNTER — Ambulatory Visit (INDEPENDENT_AMBULATORY_CARE_PROVIDER_SITE_OTHER): Payer: Medicare Other | Admitting: Family Medicine

## 2017-05-18 ENCOUNTER — Ambulatory Visit (INDEPENDENT_AMBULATORY_CARE_PROVIDER_SITE_OTHER): Payer: Medicare Other

## 2017-05-18 ENCOUNTER — Encounter: Payer: Self-pay | Admitting: Family Medicine

## 2017-05-18 VITALS — BP 129/77 | HR 75 | Temp 97.0°F | Ht 66.0 in | Wt 160.8 lb

## 2017-05-18 DIAGNOSIS — N400 Enlarged prostate without lower urinary tract symptoms: Secondary | ICD-10-CM | POA: Diagnosis not present

## 2017-05-18 DIAGNOSIS — R0602 Shortness of breath: Secondary | ICD-10-CM

## 2017-05-18 DIAGNOSIS — E785 Hyperlipidemia, unspecified: Secondary | ICD-10-CM

## 2017-05-18 DIAGNOSIS — L570 Actinic keratosis: Secondary | ICD-10-CM | POA: Diagnosis not present

## 2017-05-18 MED ORDER — ATORVASTATIN CALCIUM 10 MG PO TABS: 10.0000 mg | ORAL_TABLET | Freq: Every day | ORAL | 3 refills | Status: DC

## 2017-05-18 MED ORDER — FINASTERIDE 5 MG PO TABS: 5.0000 mg | ORAL_TABLET | Freq: Every day | ORAL | 3 refills | Status: DC

## 2017-05-18 MED ORDER — TAMSULOSIN HCL 0.4 MG PO CAPS: 0.4000 mg | ORAL_CAPSULE | Freq: Every day | ORAL | 3 refills | Status: DC

## 2017-05-18 NOTE — Addendum Note (Signed)
Addended by: Timmothy Euler on: 05/18/2017 08:40 AM   Modules accepted: Orders

## 2017-05-18 NOTE — Progress Notes (Signed)
   HPI  Patient presents today here to follow-up for chronic medical conditions also requesting EKG and chest x-ray.  Patient states he has mild shortness of breath, he "just doesn't have the breath he used to". He denies chest pain, cough, or other concerns. He has multiple family members with atrial fibrillation. He denies palpitations, presyncope, or exertional dyspnea.  Hyperlipidemia Good medication compliance  Actinic keratosis Patient requests refill of 5-fluorouracil, previously seeing dermatology.  BPH Needs refill of medication, doing well.  Patient also reports right hip weakness that last for a "split second" occasionally. No injuries. No persistent pain and no back pain. Patient also reports taking a tractor about 3 months ago and slowly healing from that, he has some tenderness in his right arch of his right foot currently.  PMH: Smoking status noted ROS: Per HPI  Objective: BP 129/77   Pulse 75   Temp (!) 97 F (36.1 C) (Oral)   Ht 5\' 6"  (1.676 m)   Wt 160 lb 12.8 oz (72.9 kg)   BMI 25.95 kg/m  Gen: NAD, alert, cooperative with exam HEENT: NCAT CV: RRR, good S1/S2, no murmur Resp: CTABL, no wheezes, non-labored Abd: SNTND, BS present, no guarding or organomegaly Ext: No edema, warm Neuro: Alert and oriented, No gross deficits MSK Normal range of motion of right hip, negative Fabere and Fadir test Small firmness in the mid plantar fascia/arch on bilateral feet, right slightly prominent than the left, no tenderness to palpation  Assessment and plan:  # Shortness of breath Very mild, no other associated symptoms that are worrisome, no red flags EKG and chest x-ray per request today. Likely deconditioning  EKG- NSR  # BPH Stable Refill Flomax  # Actinic keratosis Declined refill for 5-fluorouracil as he was previously treated by dermatology, refer back to dermatology  # Hyperlipidemia Repeat labs today, continue Lipitor    Orders Placed  This Encounter  Procedures  . DG Chest 2 View    Standing Status:   Future    Standing Expiration Date:   07/18/2018    Order Specific Question:   Reason for Exam (SYMPTOM  OR DIAGNOSIS REQUIRED)    Answer:   mild sob    Order Specific Question:   Preferred imaging location?    Answer:   Internal    Order Specific Question:   Radiology Contrast Protocol - do NOT remove file path    Answer:   [email or web address]  . Ambulatory referral to Dermatology    Referral Priority:   Routine    Referral Type:   Consultation    Referral Reason:   Specialty Services Required    Requested Specialty:   Dermatology    Number of Visits Requested:   1  . EKG 12-Lead    Meds ordered this encounter  Medications  . finasteride (PROSCAR) 5 MG tablet    Sig: Take 1 tablet (5 mg total) by mouth daily.    Dispense:  90 tablet    Refill:  3  . tamsulosin (FLOMAX) 0.4 MG CAPS capsule    Sig: Take 1 capsule (0.4 mg total) by mouth daily.    Dispense:  90 capsule    Refill:  3  . atorvastatin (LIPITOR) 10 MG tablet    Sig: Take 1 tablet (10 mg total) by mouth daily.    Dispense:  90 tablet    Refill:  [place], MD [place] 05/18/2017, 8:20 AM

## 2017-05-18 NOTE — Patient Instructions (Signed)
Great to see you!  Come back in 6 months unless you need [place] sooner.   We will work on a referral to Dr. Denna Haggard

## 2017-05-20 LAB — CBC WITH DIFFERENTIAL/PLATELET
Basophils Absolute: 0 10*3/uL (ref 0.0–0.2)
Basos: 1 %
EOS (ABSOLUTE): 0.1 10*3/uL (ref 0.0–0.4)
EOS: 2 %
HEMATOCRIT: 43 % (ref 37.5–51.0)
HEMOGLOBIN: 14 g/dL (ref 13.0–17.7)
Immature Grans (Abs): 0 10*3/uL (ref 0.0–0.1)
Immature Granulocytes: 0 %
LYMPHS: 21 %
Lymphocytes Absolute: 1.5 10*3/uL (ref 0.7–3.1)
MCH: 32.1 pg (ref 26.6–33.0)
MCHC: 32.6 g/dL (ref 31.5–35.7)
MCV: 99 fL — AB (ref 79–97)
MONOCYTES: 12 %
Monocytes Absolute: 0.8 10*3/uL (ref 0.1–0.9)
NEUTROS PCT: 64 %
Neutrophils Absolute: 4.4 10*3/uL (ref 1.4–7.0)
Platelets: 249 10*3/uL (ref 150–379)
RBC: 4.36 x10E6/uL (ref 4.14–5.80)
RDW: 12.8 % (ref 12.3–15.4)
WBC: 6.9 10*3/uL (ref 3.4–10.8)

## 2017-05-20 LAB — CMP14+EGFR
A/G RATIO: 1.7 (ref 1.2–2.2)
ALT: 16 IU/L (ref 0–44)
AST: 22 IU/L (ref 0–40)
Albumin: 3.9 g/dL (ref 3.5–4.8)
Alkaline Phosphatase: 93 IU/L (ref 39–117)
BUN / CREAT RATIO: 17 (ref 10–24)
BUN: 16 mg/dL (ref 8–27)
Bilirubin Total: 0.7 mg/dL (ref 0.0–1.2)
CALCIUM: 8.9 mg/dL (ref 8.6–10.2)
CO2: 22 mmol/L (ref 20–29)
CREATININE: 0.96 mg/dL (ref 0.76–1.27)
Chloride: 104 mmol/L (ref 96–106)
GFR, EST AFRICAN AMERICAN: 92 mL/min/{1.73_m2} (ref >59)
GFR, EST NON AFRICAN AMERICAN: 80 mL/min/{1.73_m2} (ref >59)
GLOBULIN, TOTAL: 2.3 g/dL (ref 1.5–4.5)
Glucose: 103 mg/dL — ABNORMAL HIGH (ref 65–99)
POTASSIUM: 4.1 mmol/L (ref 3.5–5.2)
SODIUM: 141 mmol/L (ref 134–144)
TOTAL PROTEIN: 6.2 g/dL (ref 6.0–8.5)

## 2017-05-20 LAB — LIPID PANEL
CHOL/HDL RATIO: 3.2 ratio (ref 0.0–5.0)
Cholesterol, Total: 141 mg/dL (ref 100–199)
HDL: 44 mg/dL (ref >39)
LDL CALC: 71 mg/dL (ref 0–99)
TRIGLYCERIDES: 130 mg/dL (ref 0–149)
VLDL Cholesterol Cal: 26 mg/dL (ref 5–40)

## 2017-05-27 ENCOUNTER — Ambulatory Visit (INDEPENDENT_AMBULATORY_CARE_PROVIDER_SITE_OTHER): Payer: Medicare Other

## 2017-05-27 DIAGNOSIS — Z23 Encounter for immunization: Secondary | ICD-10-CM | POA: Diagnosis not present

## 2017-06-17 DIAGNOSIS — D229 Melanocytic nevi, unspecified: Secondary | ICD-10-CM | POA: Diagnosis not present

## 2017-06-17 DIAGNOSIS — L720 Epidermal cyst: Secondary | ICD-10-CM | POA: Diagnosis not present

## 2017-06-17 DIAGNOSIS — L570 Actinic keratosis: Secondary | ICD-10-CM | POA: Diagnosis not present

## 2017-06-17 DIAGNOSIS — L821 Other seborrheic keratosis: Secondary | ICD-10-CM | POA: Diagnosis not present

## 2017-07-23 DIAGNOSIS — M47894 Other spondylosis, thoracic region: Secondary | ICD-10-CM | POA: Diagnosis not present

## 2017-07-23 DIAGNOSIS — Y9389 Activity, other specified: Secondary | ICD-10-CM | POA: Diagnosis not present

## 2017-07-23 DIAGNOSIS — M542 Cervicalgia: Secondary | ICD-10-CM | POA: Diagnosis not present

## 2017-07-23 DIAGNOSIS — S0990XA Unspecified injury of head, initial encounter: Secondary | ICD-10-CM | POA: Diagnosis not present

## 2017-07-23 DIAGNOSIS — R51 Headache: Secondary | ICD-10-CM | POA: Diagnosis not present

## 2017-07-23 DIAGNOSIS — W228XXA Striking against or struck by other objects, initial encounter: Secondary | ICD-10-CM | POA: Diagnosis not present

## 2017-07-23 DIAGNOSIS — Y92008 Other place in unspecified non-institutional (private) residence as the place of occurrence of the external cause: Secondary | ICD-10-CM | POA: Diagnosis not present

## 2017-07-23 DIAGNOSIS — S060X9A Concussion with loss of consciousness of unspecified duration, initial encounter: Secondary | ICD-10-CM | POA: Diagnosis not present

## 2017-07-23 DIAGNOSIS — S22020A Wedge compression fracture of second thoracic vertebra, initial encounter for closed fracture: Secondary | ICD-10-CM | POA: Diagnosis not present

## 2017-07-23 DIAGNOSIS — S0101XA Laceration without foreign body of scalp, initial encounter: Secondary | ICD-10-CM | POA: Diagnosis not present

## 2017-07-23 DIAGNOSIS — S0191XA Laceration without foreign body of unspecified part of head, initial encounter: Secondary | ICD-10-CM | POA: Diagnosis not present

## 2017-07-23 DIAGNOSIS — S199XXA Unspecified injury of neck, initial encounter: Secondary | ICD-10-CM | POA: Diagnosis not present

## 2017-07-23 DIAGNOSIS — M4184 Other forms of scoliosis, thoracic region: Secondary | ICD-10-CM | POA: Diagnosis not present

## 2017-07-23 DIAGNOSIS — M549 Dorsalgia, unspecified: Secondary | ICD-10-CM | POA: Diagnosis not present

## 2017-07-23 DIAGNOSIS — E785 Hyperlipidemia, unspecified: Secondary | ICD-10-CM | POA: Diagnosis not present

## 2017-07-23 DIAGNOSIS — N400 Enlarged prostate without lower urinary tract symptoms: Secondary | ICD-10-CM | POA: Diagnosis not present

## 2017-07-27 ENCOUNTER — Ambulatory Visit (INDEPENDENT_AMBULATORY_CARE_PROVIDER_SITE_OTHER): Payer: Medicare Other | Admitting: Family Medicine

## 2017-07-27 ENCOUNTER — Encounter: Payer: Self-pay | Admitting: Family Medicine

## 2017-07-27 VITALS — BP 153/91 | HR 72 | Temp 97.8°F | Ht 66.0 in | Wt 161.0 lb

## 2017-07-27 DIAGNOSIS — S0990XA Unspecified injury of head, initial encounter: Secondary | ICD-10-CM | POA: Diagnosis not present

## 2017-07-27 DIAGNOSIS — S22020A Wedge compression fracture of second thoracic vertebra, initial encounter for closed fracture: Secondary | ICD-10-CM

## 2017-07-27 DIAGNOSIS — S0101XA Laceration without foreign body of scalp, initial encounter: Secondary | ICD-10-CM

## 2017-07-27 NOTE — Progress Notes (Signed)
Subjective: CC: Hospital follow up PCP: Timmothy Euler, MD Raymond Morales is a 71 y.o. male presenting to clinic today for:  1. Hospital follow up Patient was seen at the [facility] on November 29 after sustaining a head injury/laceration from a falling branch.  He notes that he was using a chainsaw to cut down a tree branch at home when a large limb fell on his head and knocked him to the ground.  He notes that he fell on his bottom and felt a pop in his back when this happened.  He reports feeling dazed for several seconds.  Denies loss of consciousness.  He does report having had blurry vision for about 30 seconds after injury.  He was able to ambulate independently back to his house where he had a neighbor bring him to the emergency department.  At the emergency department he had a CT scan done of his head neck and spine.  He brings the CT thoracic spine reports which noted a mild comminuted anteriorly wedged compression fracture of T2 that was thought to be acute versus subacute no other acute abnormalities were appreciated in the spine.  He notes that the CT of his head was negative for skull fracture or brain hematoma.  Since the incident, he has been taking Tylenol with good control of aches and pains.  He notes intolerance to the Norco, citing that it made him "antsy".  He reports compliance with the Keflex that he was prescribed.  Denies fevers, chills, dizziness, blurry vision, weakness, numbness or tingling.  He reports he is essentially back to baseline.  His niece who is a nurse has been changing his dressings on his head daily.  Denies purulence.  He has been applying Neosporin and washing with mild soap and water.  He has not yet taken a shower and wonders if he is able to do this now.  No Known Allergies Past Medical History:  Diagnosis Date  . Allergy    seasonal  . BPH (benign prostatic hypertrophy)   . Hyperlipidemia    Family  History  Problem Relation Age of Onset  . Hypertension Mother   . Vision loss Mother   . Atrial fibrillation Mother   . Heart disease Father   . Thyroid disease Father   . Hypertension Sister   . Atrial fibrillation Sister     Current Outpatient Medications:  .  atorvastatin (LIPITOR) 10 MG tablet, Take 1 tablet (10 mg total) by mouth daily., Disp: 90 tablet, Rfl: 3 .  finasteride (PROSCAR) 5 MG tablet, Take 1 tablet (5 mg total) by mouth daily., Disp: 90 tablet, Rfl: 3 .  loratadine (CLARITIN) 10 MG tablet, Take 10 mg by mouth daily., Disp: , Rfl:  .  multivitamin-lutein (OCUVITE-LUTEIN) CAPS capsule, Take 1 capsule by mouth daily., Disp: , Rfl:  .  tamsulosin (FLOMAX) 0.4 MG CAPS capsule, Take 1 capsule (0.4 mg total) by mouth daily., Disp: 90 capsule, Rfl: 3  Social Hx: non smoker.  Health Maintenance: TDap administered at [facility]: Per HPI  Objective: Office vital signs reviewed. BP (!) 159/85   Pulse 72   Temp 97.8 F (36.6 C) (Oral)   Ht 5\' 6"  (1.676 m)   Wt 161 lb (73 kg)   BMI 25.99 kg/m   Physical Examination:  General: Awake, alert, well nourished, No acute distress HEENT: Normal    Eyes: PERRLA, extraocular membranes intact, sclera white, bilateral inferior orbits with  ecchymosis.    Nose: nasal turbinates moist, no nasal discharge    Throat: moist mucus membranes,, no erythema; symmetric rise of palate appreciated. Cardio: regular rate, +2 DP Pulm: no wheeze, normal work of breathing on room air MSK: 5/5 upper extremity strength Skin: Approximately 6 inch sagittal laceration with 32 staples in place appreciated on the apex of the skull.  No purulence, bleeding appreciated.  No significant erythema, induration or fluctuance seen.  No foul odors.  Minimal tenderness to palpation. Neuro: Alert and oriented x3, follows all commands, cranial nerves II through XII grossly intact.  No focal deficits.  Assessment/ Plan: 71 y.o. male   1. Traumatic injury  of head, initial encounter Patient does have some bruising underneath bilateral eyes.  No focal neurologic deficits appreciated.  Staples in place and without evidence of infection.  UTD on Tdap.  Continue Tylenol and Keflex as directed.  Patient has been scheduled for Tuesday to remove staples, as adverse weather is expected on Monday.  Patient may resume brief showers with gentle soap.  Avoid submerging head underwater, including pupils, legs, Jacuzzis.  Continue daily changes of dressing.  Home care instructions were reviewed.  Strict return precautions and reasons for emergent evaluation in the emergency department review with patient.  He voiced understanding and will follow-up as needed.  2. Laceration of scalp without foreign body, initial encounter See above  3. Closed wedge compression fracture of second thoracic vertebra, initial encounter [facility]) Per patient, currently asymptomatic.  Should he develop symptoms, could refer to orthopedics for further evaluation.  Patient does not wish to pursue surgical correction of vertebral fracture at this time.    Raymond Norlander, DO [place] [phone]

## 2017-07-27 NOTE — Patient Instructions (Addendum)
I value your feedback and appreciate you entrusting [place] with your care.  If you get a survey, I would appreciate your taking the time to let us know what your experience was like.   Your staples look good.  There is no evidence of infection.  Let us plan to reconvene on Monday or Tuesday for staple removal.  Continue home care.  Avoid bath tubs, submerging it under any type of water.  A brief shower with mild soap is fine.  Make sure to keep the dressing clean with daily dressing changes.  If you develop any other worrisome signs or symptoms of infection or neurologic changes, please seek immediate medical attention in the emergency department.  Stitches, Staples, or Adhesive Wound Closure Health care providers use stitches (sutures), staples, and certain glue (skin adhesives) to hold skin together while it heals (wound closure). You may need this treatment after you have surgery or if you cut your skin accidentally. These methods help your skin to heal more quickly and make it less likely that you will have a scar. A wound may take several months to heal completely. The type of wound you have determines when your wound gets closed. In most cases, the wound is closed as soon as possible (primary skin closure). Sometimes, closure is delayed so the wound can be cleaned and allowed to heal naturally. This reduces the chance of infection. Delayed closure may be needed if your wound:  Is caused by a bite.  Happened more than 6 hours ago.  Involves loss of skin or the tissues under the skin.  Has dirt or debris in it that cannot be removed.  Is infected.  What are the different kinds of wound closures? There are many options for wound closure. The one that your health care provider uses depends on how deep and how large your wound is. Adhesive Glue To use this type of glue to close a wound, your health care provider holds the edges of the wound together and paints the glue on the surface of your skin.  You may need more than one layer of glue. Then the wound may be covered with a light bandage (dressing). This type of skin closure may be used for small wounds that are not deep (superficial). Using glue for wound closure is less painful than other methods. It does not require a medicine that numbs the area (local anesthetic). This method also leaves nothing to be removed. Adhesive glue is often used for children and on facial wounds. Adhesive glue cannot be used for wounds that are deep, uneven, or bleeding. It is not used inside of a wound. Adhesive Strips These strips are made of sticky (adhesive), porous paper. They are applied across your skin edges like a regular adhesive bandage. You leave them on until they fall off. Adhesive strips may be used to close very superficial wounds. They may also be used along with sutures to improve the closure of your skin edges. Sutures Sutures are the oldest method of wound closure. Sutures can be made from natural substances, such as silk, or from synthetic materials, such as nylon and steel. They can be made from a material that your body can break down as your wound heals (absorbable), or they can be made from a material that needs to be removed from your skin (nonabsorbable). They come in many different strengths and sizes. Your health care provider attaches the sutures to a steel needle on one end. Sutures can be passed through  your skin, or through the tissues beneath your skin. Then they are tied and cut. Your skin edges may be closed in one continuous stitch or in separate stitches. Sutures are strong and can be used for all kinds of wounds. Absorbable sutures may be used to close tissues under the skin. The disadvantage of sutures is that they may cause skin reactions that lead to infection. Nonabsorbable sutures need to be removed. Staples When surgical staples are used to close a wound, the edges of your skin on both sides of the wound are brought close  together. A staple is placed across the wound, and an instrument secures the edges together. Staples are often used to close surgical cuts (incisions). Staples are faster to use than sutures, and they cause less skin reaction. Staples need to be removed using a tool that bends the staples away from your skin. How do I care for my wound closure?  Take medicines only as directed by your health care provider.  If you were prescribed an antibiotic medicine for your wound, finish it all even if you start to feel better.  Use ointments or creams only as directed by your health care provider.  Wash your hands with soap and water before and after touching your wound.  Do not soak your wound in water. Do not take baths, swim, or use a hot tub until your health care provider approves.  Ask your health care provider when you can start showering. Cover your wound if directed by your health care provider.  Do not take out your own sutures or staples.  Do not pick at your wound. Picking can cause an infection.  Keep all follow-up visits as directed by your health care provider. This is important. How long will I have my wound closure?  Leave adhesive glue on your skin until the glue peels away.  Leave adhesive strips on your skin until the strips fall off.  Absorbable sutures will dissolve within several days.  Nonabsorbable sutures and staples must be removed. The location of the wound will determine how long they stay in. This can range from several days to a couple of weeks. When should I seek help for my wound closure? Contact your health care provider if:  You have a fever.  You have chills.  You have drainage, redness, swelling, or pain at your wound.  There is a bad smell coming from your wound.  The skin edges of your wound start to separate after your sutures have been removed.  Your wound becomes thick, raised, and darker in color after your sutures come out (scarring).  This  information is not intended to replace advice given to you by your health care provider. Make sure you discuss any questions you have with your health care provider. Document Released: 05/06/2001 Document Revised: 04/09/2016 Document Reviewed: 01/18/2014 Elsevier Interactive Patient Education  [employer].

## 2017-07-29 NOTE — Progress Notes (Deleted)
   Subjective: MO:QHUTML removal PCP: Timmothy Euler, MD Raymond Morales is a 71 y.o. male presenting to clinic today for:  1. ***   ROS: Per HPI  No Known Allergies  Current Outpatient Medications:  .  atorvastatin (LIPITOR) 10 MG tablet, Take 1 tablet (10 mg total) by mouth daily., Disp: 90 tablet, Rfl: 3 .  finasteride (PROSCAR) 5 MG tablet, Take 1 tablet (5 mg total) by mouth daily., Disp: 90 tablet, Rfl: 3 .  loratadine (CLARITIN) 10 MG tablet, Take 10 mg by mouth daily., Disp: , Rfl:  .  multivitamin-lutein (OCUVITE-LUTEIN) CAPS capsule, Take 1 capsule by mouth daily., Disp: , Rfl:  .  tamsulosin (FLOMAX) 0.4 MG CAPS capsule, Take 1 capsule (0.4 mg total) by mouth daily., Disp: 90 capsule, Rfl: 3 Social History   Socioeconomic History  . Marital status: Married    Spouse name: Not on file  . Number of children: Not on file  . Years of education: Not on file  . Highest education level: Not on file  Social Needs  . Financial resource strain: Not on file  . Food insecurity - worry: Not on file  . Food insecurity - inability: Not on file  . Transportation needs - medical: Not on file  . Transportation needs - non-medical: Not on file  Occupational History  . Not on file  Tobacco Use  . Smoking status: Never Smoker  . Smokeless tobacco: Never Used  Substance and Sexual Activity  . Alcohol use: No  . Drug use: No  . Sexual activity: No  Other Topics Concern  . Not on file  Social History Narrative  . Not on file    Objective: Office vital signs reviewed. There were no vitals taken for this visit.  Physical Examination:  General: Awake, alert, *** nourished, No acute distress Head: *** Skin: ***  Procedure:  Staple removal Informed consent provided.  Verbal consent obtained. ***Staples were removed from the apex of the scalp. ***Bleeding. ***Immediate complications.  Patient tolerated procedure well.  Home care instructions were reviewed with the patient  and handout was provided.  Assessment/ Plan: 71 y.o. male   ***  No orders of the defined types were placed in this encounter.  No orders of the defined types were placed in this encounter.    Janora Norlander, DO [place] [phone]

## 2017-08-04 ENCOUNTER — Ambulatory Visit: Payer: Medicare Other | Admitting: Family Medicine

## 2017-08-05 ENCOUNTER — Ambulatory Visit (INDEPENDENT_AMBULATORY_CARE_PROVIDER_SITE_OTHER): Payer: Medicare Other | Admitting: Family Medicine

## 2017-08-05 ENCOUNTER — Encounter: Payer: Self-pay | Admitting: Family Medicine

## 2017-08-05 VITALS — BP 136/82 | HR 74 | Temp 97.9°F | Ht 66.0 in | Wt 161.0 lb

## 2017-08-05 DIAGNOSIS — Z4802 Encounter for removal of sutures: Secondary | ICD-10-CM

## 2017-08-05 DIAGNOSIS — S0101XD Laceration without foreign body of scalp, subsequent encounter: Secondary | ICD-10-CM

## 2017-08-05 NOTE — Progress Notes (Signed)
Subjective: CC: staple removal PCP: Timmothy Euler, MD Raymond Morales is a 71 y.o. male presenting to clinic today for:  1. Staple Removal Patient reports that he has been doing well since last time we saw each other.  He denies fevers, chills, nausea, vomiting, blurry vision, weakness, or any other neurologic deficits.  He notes that he completed his antibiotic.  He is continue to dress wound each day with Neosporin.  No redness, purulence.   ROS: Per HPI  No Known Allergies Past Medical History:  Diagnosis Date  . Allergy    seasonal  . BPH (benign prostatic hypertrophy)   . Hyperlipidemia     Current Outpatient Medications:  .  atorvastatin (LIPITOR) 10 MG tablet, Take 1 tablet (10 mg total) by mouth daily., Disp: 90 tablet, Rfl: 3 .  finasteride (PROSCAR) 5 MG tablet, Take 1 tablet (5 mg total) by mouth daily., Disp: 90 tablet, Rfl: 3 .  loratadine (CLARITIN) 10 MG tablet, Take 10 mg by mouth daily., Disp: , Rfl:  .  multivitamin-lutein (OCUVITE-LUTEIN) CAPS capsule, Take 1 capsule by mouth daily., Disp: , Rfl:  .  tamsulosin (FLOMAX) 0.4 MG CAPS capsule, Take 1 capsule (0.4 mg total) by mouth daily., Disp: 90 capsule, Rfl: 3 Social History   Socioeconomic History  . Marital status: Married    Spouse name: Not on file  . Number of children: Not on file  . Years of education: Not on file  . Highest education level: Not on file  Social Needs  . Financial resource strain: Not on file  . Food insecurity - worry: Not on file  . Food insecurity - inability: Not on file  . Transportation needs - medical: Not on file  . Transportation needs - non-medical: Not on file  Occupational History  . Not on file  Tobacco Use  . Smoking status: Never Smoker  . Smokeless tobacco: Never Used  Substance and Sexual Activity  . Alcohol use: No  . Drug use: No  . Sexual activity: No  Other Topics Concern  . Not on file  Social History Narrative  . Not on file   Family  History  Problem Relation Age of Onset  . Hypertension Mother   . Vision loss Mother   . Atrial fibrillation Mother   . Heart disease Father   . Thyroid disease Father   . Hypertension Sister   . Atrial fibrillation Sister     Objective: Office vital signs reviewed. BP 136/82   Pulse 74   Temp 97.9 F (36.6 C) (Oral)   Ht 5\' 6"  (1.676 m)   Wt 161 lb (73 kg)   BMI 25.99 kg/m   Physical Examination:  General: Awake, alert, well appearing male, No acute distress Skin: large sagittal laceration w/ 32 staples in place.  Some yellow sebaceous crusty material along scalp.  No purulence, erythema, induration, increased warmth or tenderness to area.  Procedure: Staple removal.  Verbal consent was obtained by the patient. 32 staples were removed from scalp without difficulty.  There was slight bleeding with removal of staple #6.  This resolved with applied pressure.  Patient tolerated procedure well.  No immediate complications.  Care instructions were reviewed with the patient.  Assessment/ Plan: 71 y.o. male   1. Encounter for staple removal No evidence of infection.  Tolerated procedure well.  I recommended that he avoid submerging head in water for the next 3 days while the puncture sites of the staples are allowed to  heal.  Continue applying triple antibiotic to scalp for the next 3 days.  After that, he may resume all activities as normal. Return precautions and reasons for emergent evaluation in the emergency department review with patient.  He voiced understanding and will follow-up as needed.   Raymond Norlander, DO [place] [phone]

## 2017-09-03 DIAGNOSIS — H538 Other visual disturbances: Secondary | ICD-10-CM | POA: Diagnosis not present

## 2017-09-03 DIAGNOSIS — H43392 Other vitreous opacities, left eye: Secondary | ICD-10-CM | POA: Diagnosis not present

## 2017-11-16 ENCOUNTER — Ambulatory Visit (INDEPENDENT_AMBULATORY_CARE_PROVIDER_SITE_OTHER): Payer: Medicare Other

## 2017-11-16 ENCOUNTER — Encounter: Payer: Self-pay | Admitting: Family Medicine

## 2017-11-16 ENCOUNTER — Ambulatory Visit (INDEPENDENT_AMBULATORY_CARE_PROVIDER_SITE_OTHER): Payer: Medicare Other | Admitting: Family Medicine

## 2017-11-16 VITALS — BP 138/77 | HR 82 | Temp 97.0°F | Ht 66.0 in | Wt 161.8 lb

## 2017-11-16 DIAGNOSIS — M79671 Pain in right foot: Secondary | ICD-10-CM

## 2017-11-16 DIAGNOSIS — R222 Localized swelling, mass and lump, trunk: Secondary | ICD-10-CM | POA: Diagnosis not present

## 2017-11-16 DIAGNOSIS — E785 Hyperlipidemia, unspecified: Secondary | ICD-10-CM | POA: Diagnosis not present

## 2017-11-16 DIAGNOSIS — M19071 Primary osteoarthritis, right ankle and foot: Secondary | ICD-10-CM | POA: Diagnosis not present

## 2017-11-16 NOTE — Progress Notes (Signed)
   HPI  Patient presents today here for follow-up and to discuss back and right foot.  Patient had a traumatic accident in the fall, a tree branch fell on his head for many feet above him while he was trying to cut it down.  He states that since that time he has had a small area of swelling in his left back.  He states that it hurts him whenever he overdoes it but otherwise does not bother him.  He has noticed a prominence over that area.  Also notes that about 10 months ago he twisted his right foot.  He states that due to flatfootedness he wears an arch support and inserts which helped him quite a bit.  Over the last several months he has been noticing a bony prominence in the right first metatarsal that he is not sure is from that accident or not.  Patient tolerates Lipitor well. No side effects.   PMH: Smoking status noted ROS: Per HPI  Objective: BP 138/77   Pulse 82   Temp (!) 97 F (36.1 C) (Oral)   Ht '5\' 6"'$  (1.676 m)   Wt 161 lb 12.8 oz (73.4 kg)   BMI 26.12 kg/m  Gen: NAD, alert, cooperative with exam HEENT: NCAT CV: RRR, good S1/S2, no murmur Resp: CTABL, no wheezes, non-labored Abd: SNTND, BS present, no guarding or organomegaly Ext: No edema, warm Neuro: Alert and oriented MSK Right lower back with slight swelling approximately 5 cm distal to the right CVA, no tenderness to palpation, no erythema.    Right foot with bony prominence over first metatarsal, nontender to palpation. Flat-footed  Assessment and plan:  #Hyperlipidemia Previously well controlled Continue Lipitor, labs today  #Right foot pain Improved with over-the-counter orthotic and arch strep He does have an unusual bony prominence over the first metatarsal Plain film  #Swelling of the back Patient likely has a seroma from the trauma. Exam is reassuring, no limitations in function or pain. Return to clinic with any concerns    Orders Placed This Encounter  Procedures  . DG Foot  Complete Right    Standing Status:   Future    Number of Occurrences:   1    Standing Expiration Date:   01/17/2019    Order Specific Question:   Reason for Exam (SYMPTOM  OR DIAGNOSIS REQUIRED)    Answer:   R foot bony prominence over first MT    Order Specific Question:   Preferred imaging location?    Answer:   Internal    Order Specific Question:   Radiology Contrast Protocol - do NOT remove file path    Answer:   \\[email or web address]  . CMP14+EGFR  . Lipid panel  . CBC with [place], MD [facility] 11/16/2017, 8:19 AM

## 2017-11-17 LAB — CBC WITH DIFFERENTIAL/PLATELET
BASOS ABS: 0.1 10*3/uL (ref 0.0–0.2)
Basos: 1 %
EOS (ABSOLUTE): 0.1 10*3/uL (ref 0.0–0.4)
Eos: 1 %
HEMOGLOBIN: 14.1 g/dL (ref 13.0–17.7)
Hematocrit: 44.6 % (ref 37.5–51.0)
IMMATURE GRANULOCYTES: 0 %
Immature Grans (Abs): 0 10*3/uL (ref 0.0–0.1)
Lymphocytes Absolute: 1.5 10*3/uL (ref 0.7–3.1)
Lymphs: 21 %
MCH: 31.3 pg (ref 26.6–33.0)
MCHC: 31.6 g/dL (ref 31.5–35.7)
MCV: 99 fL — ABNORMAL HIGH (ref 79–97)
MONOCYTES: 13 %
Monocytes Absolute: 0.9 10*3/uL (ref 0.1–0.9)
NEUTROS ABS: 4.3 10*3/uL (ref 1.4–7.0)
Neutrophils: 64 %
PLATELETS: 294 10*3/uL (ref 150–379)
RBC: 4.51 x10E6/uL (ref 4.14–5.80)
RDW: 13.1 % (ref 12.3–15.4)
WBC: 6.8 10*3/uL (ref 3.4–10.8)

## 2017-11-17 LAB — CMP14+EGFR
A/G RATIO: 1.6 (ref 1.2–2.2)
ALBUMIN: 4 g/dL (ref 3.5–4.8)
ALT: 21 IU/L (ref 0–44)
AST: 23 IU/L (ref 0–40)
Alkaline Phosphatase: 97 IU/L (ref 39–117)
BUN/Creatinine Ratio: 16 (ref 10–24)
BUN: 15 mg/dL (ref 8–27)
Bilirubin Total: 0.7 mg/dL (ref 0.0–1.2)
CALCIUM: 9.2 mg/dL (ref 8.6–10.2)
CO2: 23 mmol/L (ref 20–29)
Chloride: 103 mmol/L (ref 96–106)
Creatinine, Ser: 0.96 mg/dL (ref 0.76–1.27)
GFR calc Af Amer: 92 mL/min/{1.73_m2} (ref >59)
GFR, EST NON AFRICAN AMERICAN: 79 mL/min/{1.73_m2} (ref >59)
GLOBULIN, TOTAL: 2.5 g/dL (ref 1.5–4.5)
Glucose: 101 mg/dL — ABNORMAL HIGH (ref 65–99)
POTASSIUM: 3.9 mmol/L (ref 3.5–5.2)
SODIUM: 141 mmol/L (ref 134–144)
Total Protein: 6.5 g/dL (ref 6.0–8.5)

## 2017-11-17 LAB — LIPID PANEL
CHOL/HDL RATIO: 2.9 ratio (ref 0.0–5.0)
Cholesterol, Total: 147 mg/dL (ref 100–199)
HDL: 51 mg/dL (ref >39)
LDL Calculated: 72 mg/dL (ref 0–99)
TRIGLYCERIDES: 122 mg/dL (ref 0–149)
VLDL Cholesterol Cal: 24 mg/dL (ref 5–40)

## 2018-03-16 DIAGNOSIS — H25093 Other age-related incipient cataract, bilateral: Secondary | ICD-10-CM | POA: Diagnosis not present

## 2018-03-16 DIAGNOSIS — H52203 Unspecified astigmatism, bilateral: Secondary | ICD-10-CM | POA: Diagnosis not present

## 2018-03-16 DIAGNOSIS — H538 Other visual disturbances: Secondary | ICD-10-CM | POA: Diagnosis not present

## 2018-03-16 DIAGNOSIS — H524 Presbyopia: Secondary | ICD-10-CM | POA: Diagnosis not present

## 2018-03-16 DIAGNOSIS — H43393 Other vitreous opacities, bilateral: Secondary | ICD-10-CM | POA: Diagnosis not present

## 2018-03-16 DIAGNOSIS — H5213 Myopia, bilateral: Secondary | ICD-10-CM | POA: Diagnosis not present

## 2018-05-21 ENCOUNTER — Encounter: Payer: Self-pay | Admitting: Family Medicine

## 2018-05-21 ENCOUNTER — Ambulatory Visit (INDEPENDENT_AMBULATORY_CARE_PROVIDER_SITE_OTHER): Payer: Medicare Other | Admitting: Family Medicine

## 2018-05-21 VITALS — BP 126/80 | HR 73 | Temp 97.9°F | Ht 66.0 in | Wt 165.0 lb

## 2018-05-21 DIAGNOSIS — N400 Enlarged prostate without lower urinary tract symptoms: Secondary | ICD-10-CM

## 2018-05-21 DIAGNOSIS — E785 Hyperlipidemia, unspecified: Secondary | ICD-10-CM

## 2018-05-21 DIAGNOSIS — Z23 Encounter for immunization: Secondary | ICD-10-CM

## 2018-05-21 DIAGNOSIS — R42 Dizziness and giddiness: Secondary | ICD-10-CM

## 2018-05-21 DIAGNOSIS — L239 Allergic contact dermatitis, unspecified cause: Secondary | ICD-10-CM | POA: Diagnosis not present

## 2018-05-21 DIAGNOSIS — J301 Allergic rhinitis due to pollen: Secondary | ICD-10-CM | POA: Diagnosis not present

## 2018-05-21 MED ORDER — FINASTERIDE 5 MG PO TABS: 5.0000 mg | ORAL_TABLET | Freq: Every day | ORAL | 3 refills | Status: DC

## 2018-05-21 MED ORDER — TAMSULOSIN HCL 0.4 MG PO CAPS: 0.4000 mg | ORAL_CAPSULE | Freq: Every day | ORAL | 3 refills | Status: DC

## 2018-05-21 MED ORDER — TRIAMCINOLONE ACETONIDE 0.1 % EX CREA: 1.0000 "application " | TOPICAL_CREAM | Freq: Two times a day (BID) | CUTANEOUS | 0 refills | Status: DC

## 2018-05-21 MED ORDER — ATORVASTATIN CALCIUM 10 MG PO TABS: 10.0000 mg | ORAL_TABLET | Freq: Every day | ORAL | 3 refills | Status: DC

## 2018-05-21 NOTE — Progress Notes (Signed)
Subjective: CC: Chronic follow-up of BPH, hyperlipidemia and allergic rhinitis PCP: Raymond Norlander, DO JFH:Raymond Morales is a 72 y.o. male presenting to clinic today for:  1.  BPH Patient reports compliance with Proscar, Flomax.  He reports good control of symptoms.  Does not endorse any hematuria, nocturia.  2.  Hyperlipidemia Patient reports compliance with Lipitor.  He does report occasional myalgia that is worsened by ambulation.  He reports that this is not frequent.  He has not skipped any doses of Lipitor to see if it may improve off of the Lipitor.  No chest pain or shortness of breath.  3.  Allergic rhinitis/ Dizziness Patient reports some increase allergic symptoms.  He has been using Claritin intermittently for this.  He reports associated dizziness that is been ongoing for about 1 week now.  He describes the dizziness as mild room spinning.  Per his report, it is "not bad."  He notes today was actually the best day he has had but he also reports that he took his Claritin last night.  Denies any fevers, chills, purulence from nares.  No falls, nausea or vomiting.    He does go on to report an allergic reaction to trigger bites on his lower extreme days.  He notes that this seems the worse during the summer months and is wondering if there is anything topical he can apply to it.  He describes diffuse itching.  He has been forgetting to use his bug spray when he goes outside.     ROS: Per HPI  No Known Allergies Past Medical History:  Diagnosis Date  . Allergy    seasonal  . BPH (benign prostatic hypertrophy)   . Hyperlipidemia     Current Outpatient Medications:  .  atorvastatin (LIPITOR) 10 MG tablet, Take 1 tablet (10 mg total) by mouth daily., Disp: 90 tablet, Rfl: 3 .  finasteride (PROSCAR) 5 MG tablet, Take 1 tablet (5 mg total) by mouth daily., Disp: 90 tablet, Rfl: 3 .  loratadine (CLARITIN) 10 MG tablet, Take 10 mg by mouth daily., Disp: , Rfl:  .   multivitamin-lutein (OCUVITE-LUTEIN) CAPS capsule, Take 1 capsule by mouth daily., Disp: , Rfl:  .  tamsulosin (FLOMAX) 0.4 MG CAPS capsule, Take 1 capsule (0.4 mg total) by mouth daily., Disp: 90 capsule, Rfl: 3 Social History   Socioeconomic History  . Marital status: Married    Spouse name: Not on file  . Number of children: Not on file  . Years of education: Not on file  . Highest education level: Not on file  Occupational History  . Not on file  Social Needs  . Financial resource strain: Not on file  . Food insecurity:    Worry: Not on file    Inability: Not on file  . Transportation needs:    Medical: Not on file    Non-medical: Not on file  Tobacco Use  . Smoking status: Never Smoker  . Smokeless tobacco: Never Used  Substance and Sexual Activity  . Alcohol use: No  . Drug use: No  . Sexual activity: Never  Lifestyle  . Physical activity:    Days per week: Not on file    Minutes per session: Not on file  . Stress: Not on file  Relationships  . Social connections:    Talks on phone: Not on file    Gets together: Not on file    Attends religious service: Not on file    Active member  of club or organization: Not on file    Attends meetings of clubs or organizations: Not on file    Relationship status: Not on file  . Intimate partner violence:    Fear of current or ex partner: Not on file    Emotionally abused: Not on file    Physically abused: Not on file    Forced sexual activity: Not on file  Other Topics Concern  . Not on file  Social History Narrative  . Not on file   Family History  Problem Relation Age of Onset  . Hypertension Mother   . Vision loss Mother   . Atrial fibrillation Mother   . Heart disease Father   . Thyroid disease Father   . Hypertension Sister   . Atrial fibrillation Sister     Objective: Office vital signs reviewed. BP 126/80   Pulse 73   Temp 97.9 F (36.6 C) (Oral)   Ht 5' 6"  (1.676 m)   Wt 165 lb (74.8 kg)   BMI  26.63 kg/m   Physical Examination:  General: Awake, alert, well nourished, No acute distress HEENT: Normal    Neck: No masses palpated. No lymphadenopathy    Ears: Tympanic membranes intact, normal light reflex, no erythema, no bulging    Eyes: PERRLA, extraocular membranes intact, sclera white    Nose: nasal turbinates moist, clear nasal discharge    Throat: moist mucus membranes, no erythema, no tonsillar exudate.  Airway is patent Cardio: regular rate and rhythm, S1S2 heard, no murmurs appreciated Pulm: clear to auscultation bilaterally, no wheezes, rhonchi or rales; normal work of breathing on room air Skin: Several insect bites along the anterior aspects of bilateral lower extremities.  Minimal erythema.  No exudate, bleeding. Neuro: No focal neurologic deficits.  No nystagmus.  Patient ambulates independently.  Assessment/ Plan: 72 y.o. male   1. Benign prostatic hyperplasia, symptoms absent I reviewed his previous PSA levels which appear to be around 0.4.  He has no LUTS..  I have refilled his medications for the year.  We will check PSA today. - finasteride (PROSCAR) 5 MG tablet; Take 1 tablet (5 mg total) by mouth daily.  Dispense: 90 tablet; Refill: 3 - tamsulosin (FLOMAX) 0.4 MG CAPS capsule; Take 1 capsule (0.4 mg total) by mouth daily.  Dispense: 90 capsule; Refill: 3 - PSA  2. Hyperlipidemia, unspecified hyperlipidemia type We discussed that he could consider skipping a pill on days that he feels that he is having myalgia to see if this may improve symptoms.  May need to consider pulse dosing in this patient should he continued to have myalgia.  Check CMP. - CMP14+EGFR  3. Allergic rhinitis due to pollen, unspecified seasonality Controlled with Claritin.  May need to add Flonase at some point if becomes uncontrolled.  4. Allergic dermatitis I have prescribed triamcinolone for him to apply twice daily for the next 10 days.  Instructions for use discussed with the  patient.  He voiced understanding.  5. Dizziness Improving.  Likely related to allergic rhinitis.  We will continue to follow.  6. Need for influenza vaccination Administered   Orders Placed This Encounter  Procedures  . PSA  . CMP14+EGFR   Meds ordered this encounter  Medications  . atorvastatin (LIPITOR) 10 MG tablet    Sig: Take 1 tablet (10 mg total) by mouth daily.    Dispense:  90 tablet    Refill:  3  . finasteride (PROSCAR) 5 MG tablet    Sig:  Take 1 tablet (5 mg total) by mouth daily.    Dispense:  90 tablet    Refill:  3  . tamsulosin (FLOMAX) 0.4 MG CAPS capsule    Sig: Take 1 capsule (0.4 mg total) by mouth daily.    Dispense:  90 capsule    Refill:  3  . triamcinolone cream (KENALOG) 0.1 %    Sig: Apply 1 application topically 2 (two) times daily. x10 days (if needed for allergic rash)    Dispense:  45 g    Refill:  0     Raymond Lopata Windell Moulding, DO [place] [phone]

## 2018-05-21 NOTE — Patient Instructions (Signed)
I have sent in triamcinolone cream for you to apply twice daily for up to 10 days as needed for allergic rash due to sugar and insect bites.  Avoid use of the cream on your face, groin or under your arms.

## 2018-05-22 LAB — CMP14+EGFR
ALK PHOS: 102 IU/L (ref 39–117)
ALT: 28 IU/L (ref 0–44)
AST: 26 IU/L (ref 0–40)
Albumin/Globulin Ratio: 1.7 (ref 1.2–2.2)
Albumin: 4.2 g/dL (ref 3.5–4.8)
BILIRUBIN TOTAL: 0.6 mg/dL (ref 0.0–1.2)
BUN/Creatinine Ratio: 15 (ref 10–24)
BUN: 16 mg/dL (ref 8–27)
CHLORIDE: 103 mmol/L (ref 96–106)
CO2: 22 mmol/L (ref 20–29)
CREATININE: 1.05 mg/dL (ref 0.76–1.27)
Calcium: 9.3 mg/dL (ref 8.6–10.2)
GFR calc Af Amer: 82 mL/min/{1.73_m2} (ref >59)
GFR calc non Af Amer: 71 mL/min/{1.73_m2} (ref >59)
GLUCOSE: 96 mg/dL (ref 65–99)
Globulin, Total: 2.5 g/dL (ref 1.5–4.5)
Potassium: 4.1 mmol/L (ref 3.5–5.2)
Sodium: 142 mmol/L (ref 134–144)
Total Protein: 6.7 g/dL (ref 6.0–8.5)

## 2018-05-22 LAB — PSA: PROSTATE SPECIFIC AG, SERUM: 0.4 ng/mL (ref 0.0–4.0)

## 2018-11-11 DIAGNOSIS — H538 Other visual disturbances: Secondary | ICD-10-CM | POA: Diagnosis not present

## 2018-11-11 DIAGNOSIS — H3530 Unspecified macular degeneration: Secondary | ICD-10-CM | POA: Diagnosis not present

## 2018-11-16 DIAGNOSIS — H35363 Drusen (degenerative) of macula, bilateral: Secondary | ICD-10-CM | POA: Diagnosis not present

## 2018-11-16 DIAGNOSIS — H04123 Dry eye syndrome of bilateral lacrimal glands: Secondary | ICD-10-CM | POA: Diagnosis not present

## 2018-11-22 ENCOUNTER — Encounter: Payer: Self-pay | Admitting: Family Medicine

## 2018-11-22 ENCOUNTER — Ambulatory Visit (INDEPENDENT_AMBULATORY_CARE_PROVIDER_SITE_OTHER): Payer: Medicare Other | Admitting: Family Medicine

## 2018-11-22 ENCOUNTER — Other Ambulatory Visit: Payer: Self-pay

## 2018-11-22 DIAGNOSIS — E782 Mixed hyperlipidemia: Secondary | ICD-10-CM | POA: Diagnosis not present

## 2018-11-22 DIAGNOSIS — K219 Gastro-esophageal reflux disease without esophagitis: Secondary | ICD-10-CM

## 2018-11-22 DIAGNOSIS — N400 Enlarged prostate without lower urinary tract symptoms: Secondary | ICD-10-CM | POA: Diagnosis not present

## 2018-11-22 MED ORDER — OMEPRAZOLE 20 MG PO CPDR: 20.0000 mg | DELAYED_RELEASE_CAPSULE | Freq: Every day | ORAL | 1 refills | Status: DC

## 2018-11-22 NOTE — Progress Notes (Signed)
Telephone visit  Subjective: CC: chronic f/u PCP: Janora Norlander, DO RKY:HCWCB Raymond Morales is a 73 y.o. male calls for telephone consult today. Patient provides verbal consent for consult held via phone.  Location of patient: home Location of provider: [facility] Others present for call: none  1. BPH Stable. Compliant w/ Proscar and Flomax.  No concerns.  2. HLD Stable.  Compliant with Lipitor.  No concerns  3. GERD Patient reports he has been taking OTC prilosec and would like this to be prescribed.  He states that with foods like country ham he often will get burning in his stomach and this is relieved by the Prilosec.  Denies any nausea, vomiting, change in appetite, hematochezia or melena.   ROS: Per HPI  No Known Allergies Past Medical History:  Diagnosis Date  . Allergy    seasonal  . BPH (benign prostatic hypertrophy)   . Hyperlipidemia     Current Outpatient Medications:  .  atorvastatin (LIPITOR) 10 MG tablet, Take 1 tablet (10 mg total) by mouth daily., Disp: 90 tablet, Rfl: 3 .  finasteride (PROSCAR) 5 MG tablet, Take 1 tablet (5 mg total) by mouth daily., Disp: 90 tablet, Rfl: 3 .  loratadine (CLARITIN) 10 MG tablet, Take 10 mg by mouth daily., Disp: , Rfl:  .  multivitamin-lutein (OCUVITE-LUTEIN) CAPS capsule, Take 1 capsule by mouth daily., Disp: , Rfl:  .  tamsulosin (FLOMAX) 0.4 MG CAPS capsule, Take 1 capsule (0.4 mg total) by mouth daily., Disp: 90 capsule, Rfl: 3 .  triamcinolone cream (KENALOG) 0.1 %, Apply 1 application topically 2 (two) times daily. x10 days (if needed for allergic rash), Disp: 45 g, Rfl: 0  Assessment/ Plan: 73 y.o. male   1. Benign prostatic hyperplasia without lower urinary tract symptoms Stable. No refills needed  2. Mixed hyperlipidemia Stable. No refills neded.  3. Gastroesophageal reflux disease without esophagitis Stable w/ PPI.  I have sent a prescription to pharmacy.  F/u scheduled 05/25/2019. - omeprazole (PRILOSEC) 20 MG  capsule; Take 1 capsule (20 mg total) by mouth daily.  Dispense: 90 capsule; Refill: 1   Start time: 8:01am End time: 8:06am  No orders of the defined types were placed in this encounter.   Janora Norlander, DO [place] [phone]

## 2019-03-17 DIAGNOSIS — H538 Other visual disturbances: Secondary | ICD-10-CM | POA: Diagnosis not present

## 2019-03-17 DIAGNOSIS — H43393 Other vitreous opacities, bilateral: Secondary | ICD-10-CM | POA: Diagnosis not present

## 2019-03-17 DIAGNOSIS — H524 Presbyopia: Secondary | ICD-10-CM | POA: Diagnosis not present

## 2019-03-17 DIAGNOSIS — H25093 Other age-related incipient cataract, bilateral: Secondary | ICD-10-CM | POA: Diagnosis not present

## 2019-03-17 DIAGNOSIS — H5213 Myopia, bilateral: Secondary | ICD-10-CM | POA: Diagnosis not present

## 2019-03-17 DIAGNOSIS — H52203 Unspecified astigmatism, bilateral: Secondary | ICD-10-CM | POA: Diagnosis not present

## 2019-04-05 DIAGNOSIS — H538 Other visual disturbances: Secondary | ICD-10-CM | POA: Diagnosis not present

## 2019-04-05 DIAGNOSIS — H189 Unspecified disorder of cornea: Secondary | ICD-10-CM | POA: Diagnosis not present

## 2019-04-29 DIAGNOSIS — Z1159 Encounter for screening for other viral diseases: Secondary | ICD-10-CM | POA: Diagnosis not present

## 2019-05-19 DIAGNOSIS — H52213 Irregular astigmatism, bilateral: Secondary | ICD-10-CM | POA: Diagnosis not present

## 2019-05-19 DIAGNOSIS — H2513 Age-related nuclear cataract, bilateral: Secondary | ICD-10-CM | POA: Diagnosis not present

## 2019-05-19 DIAGNOSIS — H1852 Epithelial (juvenile) corneal dystrophy: Secondary | ICD-10-CM | POA: Diagnosis not present

## 2019-05-20 DIAGNOSIS — H2513 Age-related nuclear cataract, bilateral: Secondary | ICD-10-CM | POA: Insufficient documentation

## 2019-05-20 DIAGNOSIS — H52213 Irregular astigmatism, bilateral: Secondary | ICD-10-CM | POA: Insufficient documentation

## 2019-05-20 DIAGNOSIS — H18529 Epithelial (juvenile) corneal dystrophy, unspecified eye: Secondary | ICD-10-CM | POA: Insufficient documentation

## 2019-05-24 ENCOUNTER — Other Ambulatory Visit: Payer: Self-pay

## 2019-05-25 ENCOUNTER — Ambulatory Visit (INDEPENDENT_AMBULATORY_CARE_PROVIDER_SITE_OTHER): Payer: Medicare Other | Admitting: Family Medicine

## 2019-05-25 ENCOUNTER — Encounter: Payer: Self-pay | Admitting: Family Medicine

## 2019-05-25 VITALS — BP 122/72 | HR 83 | Temp 97.3°F | Ht 66.0 in | Wt 167.0 lb

## 2019-05-25 DIAGNOSIS — R718 Other abnormality of red blood cells: Secondary | ICD-10-CM

## 2019-05-25 DIAGNOSIS — Z91038 Other insect allergy status: Secondary | ICD-10-CM

## 2019-05-25 DIAGNOSIS — N400 Enlarged prostate without lower urinary tract symptoms: Secondary | ICD-10-CM | POA: Diagnosis not present

## 2019-05-25 DIAGNOSIS — E782 Mixed hyperlipidemia: Secondary | ICD-10-CM

## 2019-05-25 DIAGNOSIS — K219 Gastro-esophageal reflux disease without esophagitis: Secondary | ICD-10-CM

## 2019-05-25 DIAGNOSIS — Z23 Encounter for immunization: Secondary | ICD-10-CM | POA: Diagnosis not present

## 2019-05-25 MED ORDER — OMEPRAZOLE 20 MG PO CPDR: 20.0000 mg | DELAYED_RELEASE_CAPSULE | Freq: Every day | ORAL | 3 refills | Status: DC

## 2019-05-25 MED ORDER — TRIAMCINOLONE ACETONIDE 0.1 % EX CREA: 1.0000 "application " | TOPICAL_CREAM | Freq: Two times a day (BID) | CUTANEOUS | 0 refills | Status: DC

## 2019-05-25 MED ORDER — ATORVASTATIN CALCIUM 10 MG PO TABS: 10.0000 mg | ORAL_TABLET | Freq: Every day | ORAL | 3 refills | Status: DC

## 2019-05-25 MED ORDER — TAMSULOSIN HCL 0.4 MG PO CAPS: 0.4000 mg | ORAL_CAPSULE | Freq: Every day | ORAL | 3 refills | Status: DC

## 2019-05-25 MED ORDER — FINASTERIDE 5 MG PO TABS: 5.0000 mg | ORAL_TABLET | Freq: Every day | ORAL | 3 refills | Status: DC

## 2019-05-25 NOTE — Patient Instructions (Signed)

## 2019-05-25 NOTE — Progress Notes (Signed)
Subjective: CC: Chronic follow-up of BPH, hyperlipidemia and GERD PCP: Raymond Norlander, DO Raymond Morales is a 73 y.o. male presenting to clinic today for:  1.  BPH Patient reports compliance with Proscar, Flomax.  He reports good control of symptoms.  He gets up 1-2 times per night to urinate.  Denies any increased daytime frequency, dribbling, incomplete voiding.  2.  Hyperlipidemia Patient reports compliance with Lipitor.  No chest pain, SOB, abdominal pain.  3. GERD Patient compliant with omeprazole daily.  He notes good appetite.  Denies any early satiety, unplanned weight loss, hematochezia or melena.  No nausea vomiting or abdominal pain.  4.  Allergic reaction to insect bites Patient reports that he frequently has allergic reactions to insect bites, particularly ticks and sugars.  He often will get a welt at the bite site which is relieved by topical triamcinolone.  He would like to get a refill on this.   ROS: Per HPI  No Known Allergies Past Medical History:  Diagnosis Date  . Allergy    seasonal  . BPH (benign prostatic hypertrophy)   . Hyperlipidemia     Current Outpatient Medications:  .  atorvastatin (LIPITOR) 10 MG tablet, Take 1 tablet (10 mg total) by mouth daily., Disp: 90 tablet, Rfl: 3 .  finasteride (PROSCAR) 5 MG tablet, Take 1 tablet (5 mg total) by mouth daily., Disp: 90 tablet, Rfl: 3 .  loratadine (CLARITIN) 10 MG tablet, Take 10 mg by mouth daily., Disp: , Rfl:  .  multivitamin-lutein (OCUVITE-LUTEIN) CAPS capsule, Take 1 capsule by mouth daily., Disp: , Rfl:  .  omeprazole (PRILOSEC) 20 MG capsule, Take 1 capsule (20 mg total) by mouth daily., Disp: 90 capsule, Rfl: 1 .  tamsulosin (FLOMAX) 0.4 MG CAPS capsule, Take 1 capsule (0.4 mg total) by mouth daily., Disp: 90 capsule, Rfl: 3 .  triamcinolone cream (KENALOG) 0.1 %, Apply 1 application topically 2 (two) times daily. x10 days (if needed for allergic rash), Disp: 45 g, Rfl: 0 Social  History   Socioeconomic History  . Marital status: Married    Spouse name: Not on file  . Number of children: Not on file  . Years of education: Not on file  . Highest education level: Not on file  Occupational History  . Not on file  Social Needs  . Financial resource strain: Not on file  . Food insecurity    Worry: Not on file    Inability: Not on file  . Transportation needs    Medical: Not on file    Non-medical: Not on file  Tobacco Use  . Smoking status: Never Smoker  . Smokeless tobacco: Never Used  Substance and Sexual Activity  . Alcohol use: No  . Drug use: No  . Sexual activity: Never  Lifestyle  . Physical activity    Days per week: Not on file    Minutes per session: Not on file  . Stress: Not on file  Relationships  . Social Herbalist on phone: Not on file    Gets together: Not on file    Attends religious service: Not on file    Active member of club or organization: Not on file    Attends meetings of clubs or organizations: Not on file    Relationship status: Not on file  . Intimate partner violence    Fear of current or ex partner: Not on file    Emotionally abused: Not on file  Physically abused: Not on file    Forced sexual activity: Not on file  Other Topics Concern  . Not on file  Social History Narrative  . Not on file   Family History  Problem Relation Age of Onset  . Hypertension Mother   . Vision loss Mother   . Atrial fibrillation Mother   . Heart disease Father   . Thyroid disease Father   . Hypertension Sister   . Atrial fibrillation Sister     Objective: Office vital signs reviewed. BP 122/72   Pulse 83   Temp (!) 97.3 F (36.3 C) (Temporal)   Ht 5' 6"  (1.676 m)   Wt 167 lb (75.8 kg)   SpO2 97%   BMI 26.95 kg/m   Physical Examination:  General: Awake, alert, well nourished, No acute distress HEENT: Normal, sclera white, MMM Cardio: regular rate and rhythm, S1S2 heard, no murmurs appreciated Pulm:  clear to auscultation bilaterally, no wheezes, rhonchi or rales; normal work of breathing on room air GI: Protuberant, soft, nontender, nondistended.  No palpable hepatomegaly.  Bowel sounds present x4. Skin: no active lesions.  Assessment/ Plan: 73 y.o. male   1. Mixed hyperlipidemia Check fasting lipid panel.  Lipitor renewed - CMP14+EGFR - Lipid Panel - TSH - atorvastatin (LIPITOR) 10 MG tablet; Take 1 tablet (10 mg total) by mouth daily.  Dispense: 90 tablet; Refill: 3  2. Gastroesophageal reflux disease without esophagitis Well-controlled.  Continue omeprazole - CBC - omeprazole (PRILOSEC) 20 MG capsule; Take 1 capsule (20 mg total) by mouth daily.  Dispense: 90 capsule; Refill: 3  3. Benign prostatic hyperplasia without lower urinary tract symptoms Some nighttime symptoms but nothing significant.  Patient wishes to continue current regimen.  Refill sent - PSA - CBC - finasteride (PROSCAR) 5 MG tablet; Take 1 tablet (5 mg total) by mouth daily.  Dispense: 90 tablet; Refill: 3 - tamsulosin (FLOMAX) 0.4 MG CAPS capsule; Take 1 capsule (0.4 mg total) by mouth daily.  Dispense: 90 capsule; Refill: 3  4. Elevated MCV - CBC  5. Allergic reaction to insect bite No active lesions currently but he would like to have this on hand for future use.  Refill sent - triamcinolone cream (KENALOG) 0.1 %; Apply 1 application topically 2 (two) times daily. x10 days (if needed for allergic rash)  Dispense: 45 g; Refill: 0   No orders of the defined types were placed in this encounter.  No orders of the defined types were placed in this encounter.    Raymond Norlander, DO [place] [phone]

## 2019-05-26 LAB — LIPID PANEL
Chol/HDL Ratio: 3.3 ratio (ref 0.0–5.0)
Cholesterol, Total: 161 mg/dL (ref 100–199)
HDL: 49 mg/dL (ref >39)
LDL Chol Calc (NIH): 88 mg/dL (ref 0–99)
Triglycerides: 136 mg/dL (ref 0–149)
VLDL Cholesterol Cal: 24 mg/dL (ref 5–40)

## 2019-05-26 LAB — CMP14+EGFR
ALT: 21 IU/L (ref 0–44)
AST: 22 IU/L (ref 0–40)
Albumin/Globulin Ratio: 1.7 (ref 1.2–2.2)
Albumin: 4 g/dL (ref 3.7–4.7)
Alkaline Phosphatase: 112 IU/L (ref 39–117)
BUN/Creatinine Ratio: 14 (ref 10–24)
BUN: 15 mg/dL (ref 8–27)
Bilirubin Total: 0.8 mg/dL (ref 0.0–1.2)
CO2: 23 mmol/L (ref 20–29)
Calcium: 9.2 mg/dL (ref 8.6–10.2)
Chloride: 105 mmol/L (ref 96–106)
Creatinine, Ser: 1.09 mg/dL (ref 0.76–1.27)
GFR calc Af Amer: 78 mL/min/{1.73_m2} (ref >59)
GFR calc non Af Amer: 67 mL/min/{1.73_m2} (ref >59)
Globulin, Total: 2.4 g/dL (ref 1.5–4.5)
Glucose: 107 mg/dL — ABNORMAL HIGH (ref 65–99)
Potassium: 4.1 mmol/L (ref 3.5–5.2)
Sodium: 140 mmol/L (ref 134–144)
Total Protein: 6.4 g/dL (ref 6.0–8.5)

## 2019-05-26 LAB — CBC
Hematocrit: 42.2 % (ref 37.5–51.0)
Hemoglobin: 14.3 g/dL (ref 13.0–17.7)
MCH: 32.1 pg (ref 26.6–33.0)
MCHC: 33.9 g/dL (ref 31.5–35.7)
MCV: 95 fL (ref 79–97)
Platelets: 273 10*3/uL (ref 150–450)
RBC: 4.45 x10E6/uL (ref 4.14–5.80)
RDW: 11.8 % (ref 11.6–15.4)
WBC: 7.5 10*3/uL (ref 3.4–10.8)

## 2019-05-26 LAB — PSA: Prostate Specific Ag, Serum: 0.4 ng/mL (ref 0.0–4.0)

## 2019-05-26 LAB — TSH: TSH: 2.27 u[IU]/mL (ref 0.450–4.500)

## 2019-07-18 DIAGNOSIS — Z9889 Other specified postprocedural states: Secondary | ICD-10-CM | POA: Insufficient documentation

## 2019-07-22 IMAGING — DX DG FOOT COMPLETE 3+V*R*
3 series · 3 of 3 positions shown · non-contrast
Comparison: None.

CLINICAL DATA: 71-year-old male with knot on top of foot. No
reported trauma. Initial encounter.

EXAM:
RIGHT FOOT COMPLETE - 3+ VIEW

[foot ap]
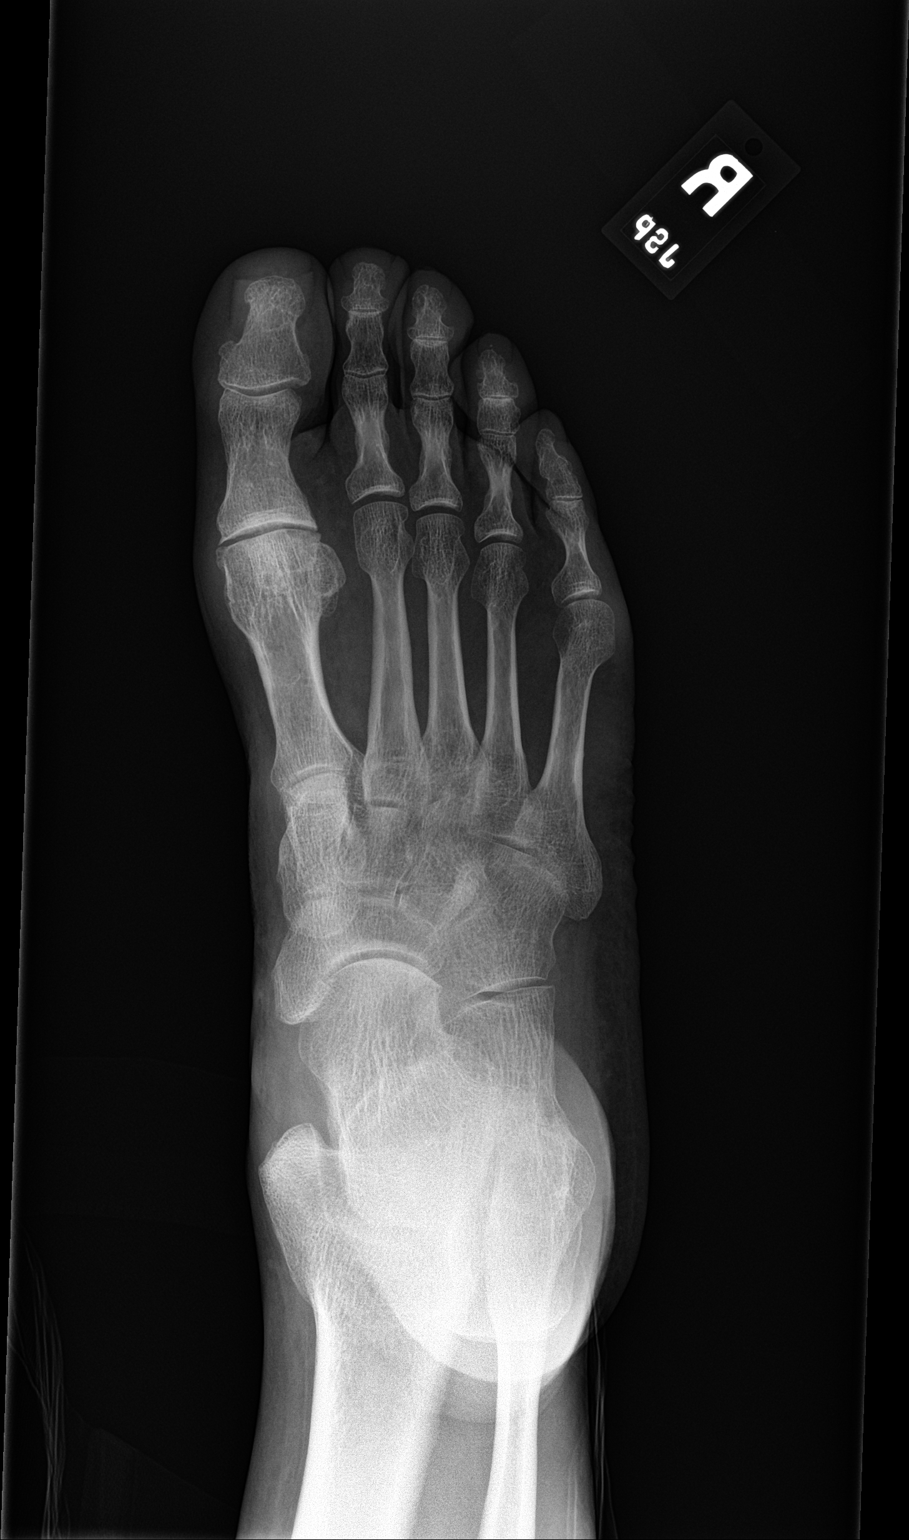

[foot obl]
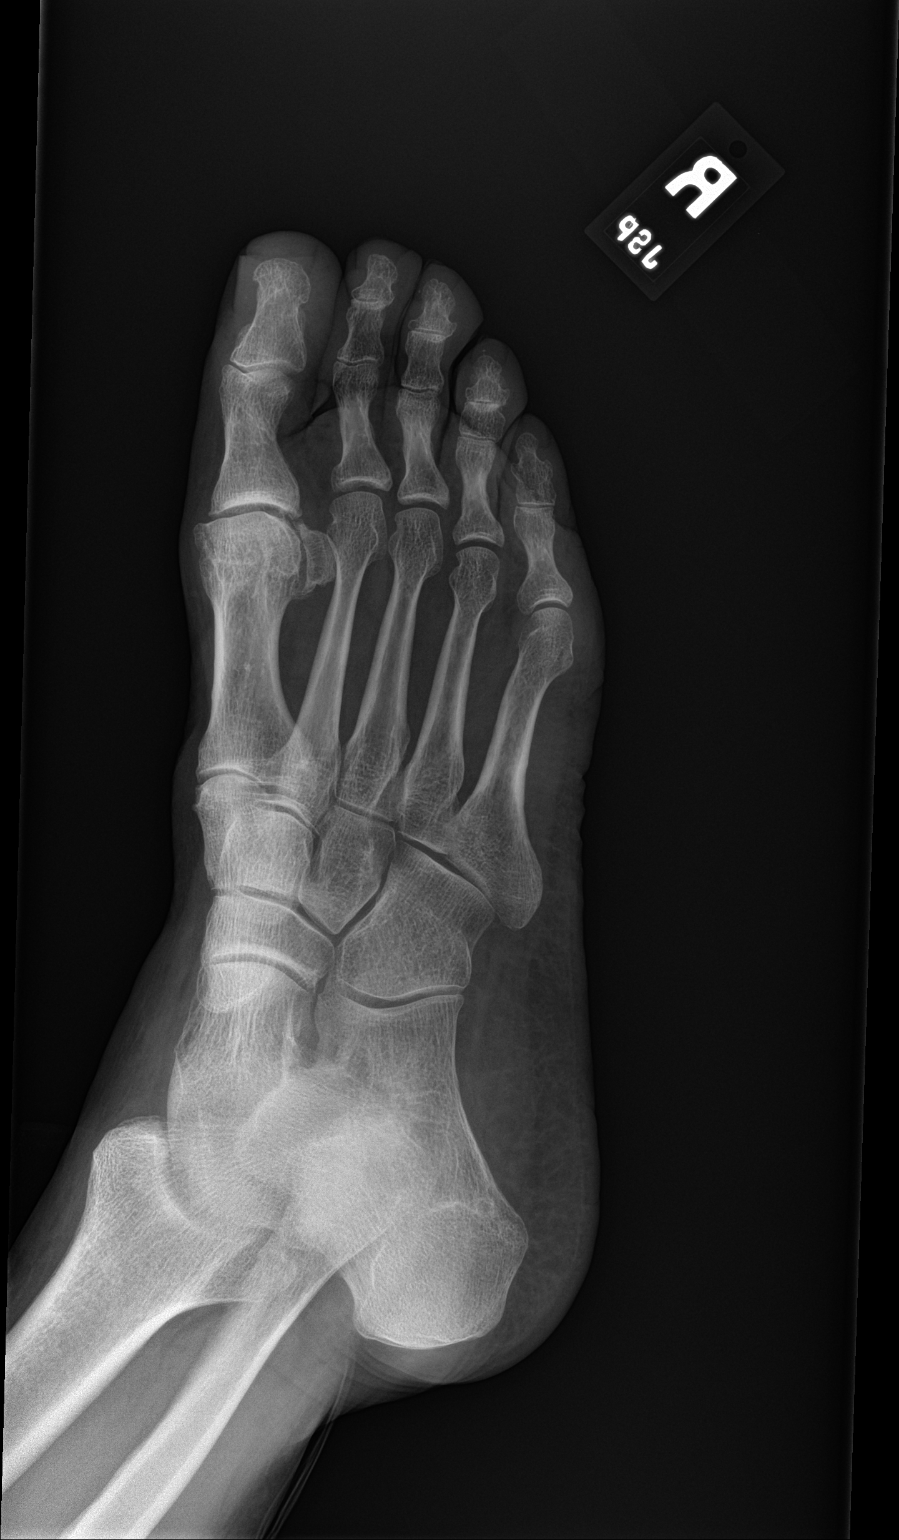

[foot lat]
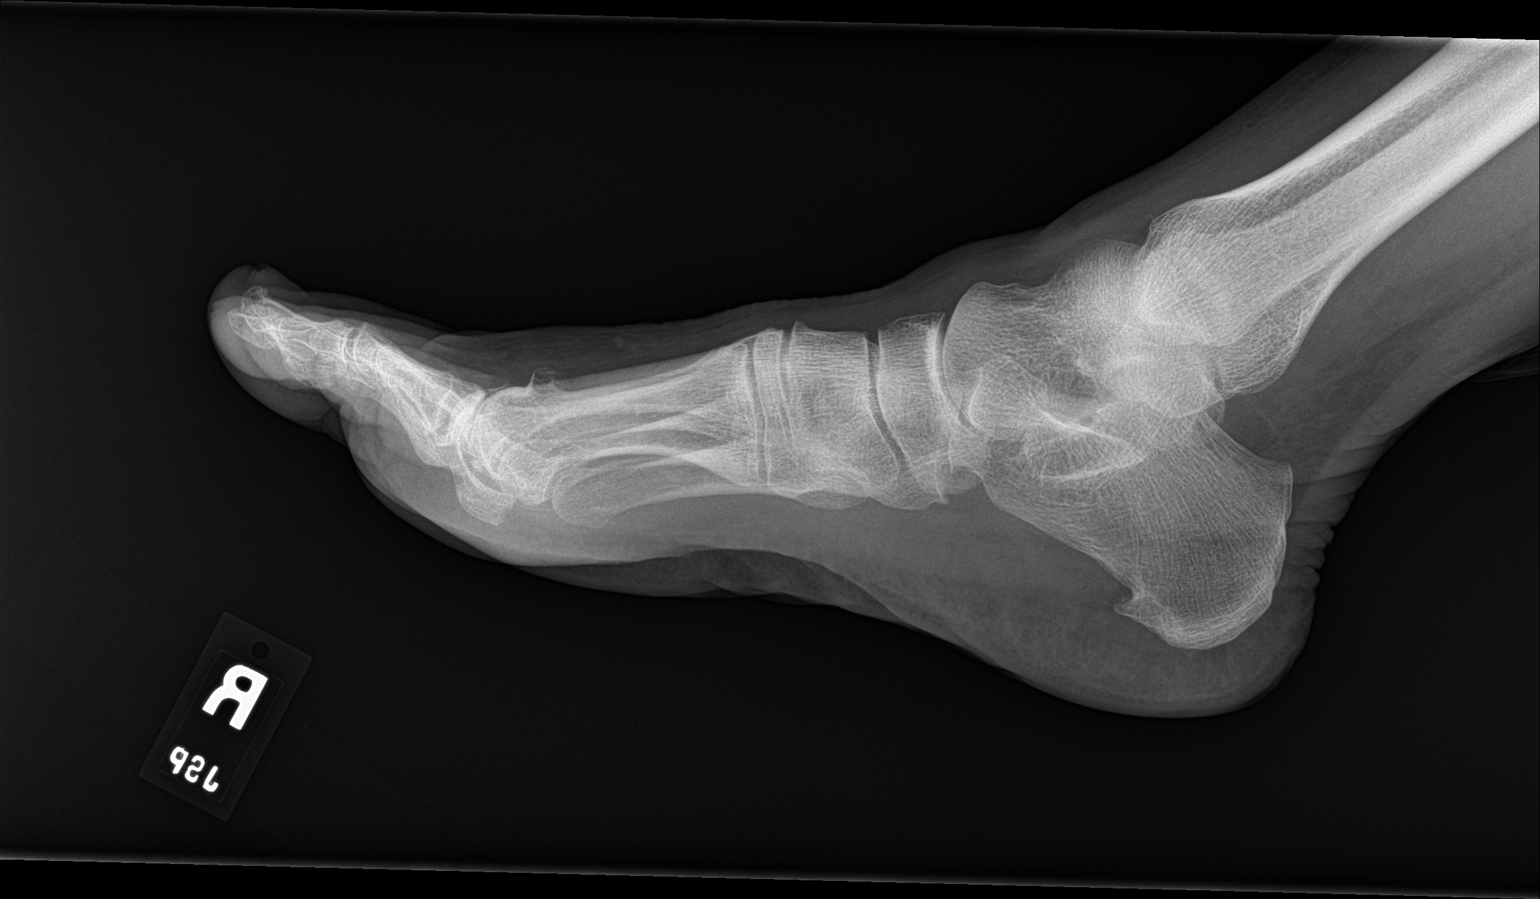

[3 of 3 positions shown; findings below may reference images not displayed]

FINDINGS: No fracture or dislocation.

Degenerative changes right first metatarsal phalangeal joint space.

Mild degenerative changes with dorsal spur medial cuneiform.

Minimal dorsal projection of the talar head.

Tiny plantar spur.
IMPRESSION: No discrete soft tissue abnormality is noted. Question if what the
patient feels as knot on top of foot is related to mild degenerative
changes with dorsal spur of the medial cuneiform or slightly
dorsally angulated talar head.

Mild degenerative changes right first metatarsal phalangeal joint
space.

Small plantar spur.

## 2019-11-23 ENCOUNTER — Other Ambulatory Visit: Payer: Self-pay

## 2019-11-23 ENCOUNTER — Ambulatory Visit (INDEPENDENT_AMBULATORY_CARE_PROVIDER_SITE_OTHER): Payer: Medicare Other | Admitting: Family Medicine

## 2019-11-23 ENCOUNTER — Encounter: Payer: Self-pay | Admitting: Family Medicine

## 2019-11-23 VITALS — BP 127/75 | HR 77 | Temp 96.9°F | Ht 66.0 in | Wt 165.0 lb

## 2019-11-23 DIAGNOSIS — N400 Enlarged prostate without lower urinary tract symptoms: Secondary | ICD-10-CM

## 2019-11-23 DIAGNOSIS — E782 Mixed hyperlipidemia: Secondary | ICD-10-CM

## 2019-11-23 DIAGNOSIS — L239 Allergic contact dermatitis, unspecified cause: Secondary | ICD-10-CM | POA: Diagnosis not present

## 2019-11-23 NOTE — Progress Notes (Signed)
Subjective: CC: HLD, BPH, rash PCP: Janora Norlander, DO [id number] Raymond Morales is a 74 y.o. male presenting to clinic today for:  1.  Hyperlipidemia Patient reports compliance with Lipitor 10 mg daily.  No chest pain, shortness of breath, edema or dizziness.  He continues to be physically active.  2.  BPH Patient reports compliance of Flomax 0.4 mg daily.  Urine output is stable and improved by this medication.  Does not report any lightheadedness.  3.  Rash Patient reports ongoing intermittent use of triamcinolone.  He notes that he has a small dry itchy rash noted between digits 4 and 5 on the left hand.  He notes it started out as a cat scratch and he started itching it and it became thickened.  Symptoms are getting better with the triamcinolone.  He does not need refills.   ROS: Per HPI  Allergies  Allergen Reactions  . Latex     Other reaction(s): Unknown   Past Medical History:  Diagnosis Date  . Allergy    seasonal  . BPH (benign prostatic hypertrophy)   . Hyperlipidemia     Current Outpatient Medications:  .  atorvastatin (LIPITOR) 10 MG tablet, Take 1 tablet (10 mg total) by mouth daily., Disp: 90 tablet, Rfl: 3 .  finasteride (PROSCAR) 5 MG tablet, Take 1 tablet (5 mg total) by mouth daily., Disp: 90 tablet, Rfl: 3 .  loratadine (CLARITIN) 10 MG tablet, Take 10 mg by mouth daily., Disp: , Rfl:  .  omeprazole (PRILOSEC) 20 MG capsule, Take 1 capsule (20 mg total) by mouth daily., Disp: 90 capsule, Rfl: 3 .  tamsulosin (FLOMAX) 0.4 MG CAPS capsule, Take 1 capsule (0.4 mg total) by mouth daily., Disp: 90 capsule, Rfl: 3 .  triamcinolone cream (KENALOG) 0.1 %, Apply 1 application topically 2 (two) times daily. x10 days (if needed for allergic rash), Disp: 45 g, Rfl: 0 .  multivitamin-lutein (OCUVITE-LUTEIN) CAPS capsule, Take 1 capsule by mouth daily., Disp: , Rfl:  Social History   Socioeconomic History  . Marital status: Married    Spouse name: Not on file  .  Number of children: Not on file  . Years of education: Not on file  . Highest education level: Not on file  Occupational History  . Not on file  Tobacco Use  . Smoking status: Never Smoker  . Smokeless tobacco: Never Used  Substance and Sexual Activity  . Alcohol use: No  . Drug use: No  . Sexual activity: Never  Other Topics Concern  . Not on file  Social History Narrative  . Not on file   Social Determinants of Health   Financial Resource Strain:   . Difficulty of Paying Living Expenses:   Food Insecurity:   . Worried About Charity fundraiser in the Last Year:   . Arboriculturist in the Last Year:   Transportation Needs:   . Film/video editor (Medical):   Marland Kitchen Lack of Transportation (Non-Medical):   Physical Activity:   . Days of Exercise per Week:   . Minutes of Exercise per Session:   Stress:   . Feeling of Stress :   Social Connections:   . Frequency of Communication with Friends and Family:   . Frequency of Social Gatherings with Friends and Family:   . Attends Religious Services:   . Active Member of Clubs or Organizations:   . Attends Archivist Meetings:   Marland Kitchen Marital Status:   Intimate Production manager  Violence:   . Fear of Current or Ex-Partner:   . Emotionally Abused:   Marland Kitchen Physically Abused:   . Sexually Abused:    Family History  Problem Relation Age of Onset  . Hypertension Mother   . Vision loss Mother   . Atrial fibrillation Mother   . Heart disease Father   . Thyroid disease Father   . Hypertension Sister   . Atrial fibrillation Sister     Objective: Office vital signs reviewed. BP 127/75   Pulse 77   Temp (!) 96.9 F (36.1 C) (Temporal)   Ht 5\' 6"  (1.676 m)   Wt 165 lb (74.8 kg)   SpO2 97%   BMI 26.63 kg/m   Physical Examination:  General: Awake, alert, well nourished, No acute distress HEENT: Normal, sclera white, MMM Cardio: regular rate and rhythm, S1S2 heard, no murmurs appreciated Pulm: clear to auscultation bilaterally,  no wheezes, rhonchi or rales; normal work of breathing on room air Extremities: warm, well perfused, No edema, cyanosis or clubbing; +2 pulses bilaterally MSK: normal gait and station Skin: dry; hyperkeratotic area noted between digits 4 and 5 on the dorsal aspect of the hand.  There is no skin breakdown.  No erythema, increased warmth or drainage.  Assessment/ Plan: 74 y.o. male   1. Mixed hyperlipidemia Stable.  No refills needed.  No fasting labs needed until September.  He will schedule his annual physical for September.  2. Benign prostatic hyperplasia without lower urinary tract symptoms Stable.  Continue Flomax.  3. Allergic dermatitis Responding to triamcinolone.  Does not need refills.   No orders of the defined types were placed in this encounter.  No orders of the defined types were placed in this encounter.    Janora Norlander, DO [place] [phone]

## 2020-03-08 DIAGNOSIS — H5213 Myopia, bilateral: Secondary | ICD-10-CM | POA: Diagnosis not present

## 2020-03-08 DIAGNOSIS — H52203 Unspecified astigmatism, bilateral: Secondary | ICD-10-CM | POA: Diagnosis not present

## 2020-03-08 DIAGNOSIS — H18521 Epithelial (juvenile) corneal dystrophy, right eye: Secondary | ICD-10-CM | POA: Diagnosis not present

## 2020-03-08 DIAGNOSIS — H25093 Other age-related incipient cataract, bilateral: Secondary | ICD-10-CM | POA: Diagnosis not present

## 2020-03-08 DIAGNOSIS — H524 Presbyopia: Secondary | ICD-10-CM | POA: Diagnosis not present

## 2020-03-08 DIAGNOSIS — H538 Other visual disturbances: Secondary | ICD-10-CM | POA: Diagnosis not present

## 2020-04-25 ENCOUNTER — Other Ambulatory Visit: Payer: Self-pay | Admitting: Family Medicine

## 2020-04-25 DIAGNOSIS — E782 Mixed hyperlipidemia: Secondary | ICD-10-CM

## 2020-04-26 DIAGNOSIS — H52203 Unspecified astigmatism, bilateral: Secondary | ICD-10-CM | POA: Diagnosis not present

## 2020-04-26 DIAGNOSIS — H18521 Epithelial (juvenile) corneal dystrophy, right eye: Secondary | ICD-10-CM | POA: Diagnosis not present

## 2020-04-26 DIAGNOSIS — H25093 Other age-related incipient cataract, bilateral: Secondary | ICD-10-CM | POA: Diagnosis not present

## 2020-04-26 DIAGNOSIS — H538 Other visual disturbances: Secondary | ICD-10-CM | POA: Diagnosis not present

## 2020-04-26 DIAGNOSIS — H5213 Myopia, bilateral: Secondary | ICD-10-CM | POA: Diagnosis not present

## 2020-04-26 DIAGNOSIS — H524 Presbyopia: Secondary | ICD-10-CM | POA: Diagnosis not present

## 2020-05-23 ENCOUNTER — Other Ambulatory Visit: Payer: Self-pay

## 2020-05-23 ENCOUNTER — Ambulatory Visit (INDEPENDENT_AMBULATORY_CARE_PROVIDER_SITE_OTHER): Payer: Medicare Other | Admitting: Family Medicine

## 2020-05-23 ENCOUNTER — Encounter: Payer: Self-pay | Admitting: Family Medicine

## 2020-05-23 VITALS — BP 137/78 | HR 80 | Temp 97.6°F | Ht 66.0 in | Wt 168.4 lb

## 2020-05-23 DIAGNOSIS — K219 Gastro-esophageal reflux disease without esophagitis: Secondary | ICD-10-CM | POA: Diagnosis not present

## 2020-05-23 DIAGNOSIS — J310 Chronic rhinitis: Secondary | ICD-10-CM | POA: Diagnosis not present

## 2020-05-23 DIAGNOSIS — J329 Chronic sinusitis, unspecified: Secondary | ICD-10-CM

## 2020-05-23 DIAGNOSIS — E559 Vitamin D deficiency, unspecified: Secondary | ICD-10-CM

## 2020-05-23 DIAGNOSIS — Z91038 Other insect allergy status: Secondary | ICD-10-CM | POA: Diagnosis not present

## 2020-05-23 DIAGNOSIS — R718 Other abnormality of red blood cells: Secondary | ICD-10-CM | POA: Diagnosis not present

## 2020-05-23 DIAGNOSIS — N400 Enlarged prostate without lower urinary tract symptoms: Secondary | ICD-10-CM

## 2020-05-23 DIAGNOSIS — Z23 Encounter for immunization: Secondary | ICD-10-CM | POA: Diagnosis not present

## 2020-05-23 DIAGNOSIS — E782 Mixed hyperlipidemia: Secondary | ICD-10-CM

## 2020-05-23 MED ORDER — FINASTERIDE 5 MG PO TABS: 5.0000 mg | ORAL_TABLET | Freq: Every day | ORAL | 3 refills | Status: DC

## 2020-05-23 MED ORDER — TRIAMCINOLONE ACETONIDE 0.1 % EX CREA: 1.0000 "application " | TOPICAL_CREAM | Freq: Two times a day (BID) | CUTANEOUS | 0 refills | Status: DC

## 2020-05-23 MED ORDER — AZELASTINE HCL 0.1 % NA SOLN: 1.0000 | Freq: Two times a day (BID) | NASAL | 0 refills | Status: DC

## 2020-05-23 MED ORDER — OMEPRAZOLE 20 MG PO CPDR: 20.0000 mg | DELAYED_RELEASE_CAPSULE | Freq: Every day | ORAL | 3 refills | Status: DC

## 2020-05-23 MED ORDER — TAMSULOSIN HCL 0.4 MG PO CAPS: 0.4000 mg | ORAL_CAPSULE | Freq: Every day | ORAL | 3 refills | Status: DC

## 2020-05-23 MED ORDER — ATORVASTATIN CALCIUM 10 MG PO TABS: 10.0000 mg | ORAL_TABLET | Freq: Every day | ORAL | 3 refills | Status: DC

## 2020-05-23 MED ORDER — AMOXICILLIN-POT CLAVULANATE 875-125 MG PO TABS: 1.0000 | ORAL_TABLET | Freq: Two times a day (BID) | ORAL | 0 refills | Status: DC

## 2020-05-23 NOTE — Patient Instructions (Signed)

## 2020-05-23 NOTE — Progress Notes (Signed)
Subjective: CC: HLD, BPH, sinus PCP: Janora Norlander, DO ZOX:WRUEA Morales is Raymond 74 y.o. male presenting to clinic today for:  1.  Hyperlipidemia Patient reports compliance with Lipitor 10 mg daily.  No CP, SOB, edema  2.  BPH Patient reports compliance of Flomax 0.4 mg daily.  Denies any changes in UOP.  3.  Sinusitis Reports about Raymond 6-week history of sinus pressure, left ear fullness and postnasal drip. He thinks it started after being in contact with black mold. Symptoms have been intermittent since then. About 5 days ago he started over-the-counter Nasacort and has been alternating Motrin and Tylenol. This seems to be helping some. 3 weeks ago he had an episode of dizziness that lasted about 2 hours and subsequently resulted in nausea and vomiting. He has had no recurrent symptoms since then. 4 weeks ago he had Raymond root canal.   ROS: Per HPI  Allergies  Allergen Reactions  . Latex     Other reaction(s): Unknown   Past Medical History:  Diagnosis Date  . Allergy    seasonal  . BPH (benign prostatic hypertrophy)   . Hyperlipidemia     Current Outpatient Medications:  .  atorvastatin (LIPITOR) 10 MG tablet, TAKE 1 TABLET BY MOUTH ONCE DAILY., Disp: 90 tablet, Rfl: 0 .  finasteride (PROSCAR) 5 MG tablet, Take 1 tablet (5 mg total) by mouth daily., Disp: 90 tablet, Rfl: 3 .  loratadine (CLARITIN) 10 MG tablet, Take 10 mg by mouth daily., Disp: , Rfl:  .  omeprazole (PRILOSEC) 20 MG capsule, Take 1 capsule (20 mg total) by mouth daily., Disp: 90 capsule, Rfl: 3 .  tamsulosin (FLOMAX) 0.4 MG CAPS capsule, Take 1 capsule (0.4 mg total) by mouth daily., Disp: 90 capsule, Rfl: 3 .  triamcinolone cream (KENALOG) 0.1 %, Apply 1 application topically 2 (two) times daily. x10 days (if needed for allergic rash), Disp: 45 g, Rfl: 0 Social History   Socioeconomic History  . Marital status: Married    Spouse name: Not on file  . Number of children: Not on file  . Years of education:  Not on file  . Highest education level: Not on file  Occupational History  . Not on file  Tobacco Use  . Smoking status: Never Smoker  . Smokeless tobacco: Never Used  Vaping Use  . Vaping Use: Never used  Substance and Sexual Activity  . Alcohol use: No  . Drug use: No  . Sexual activity: Never  Other Topics Concern  . Not on file  Social History Narrative  . Not on file   Social Determinants of Health   Financial Resource Strain:   . Difficulty of Paying Living Expenses: Not on file  Food Insecurity:   . Worried About Charity fundraiser in the Last Year: Not on file  . Ran Out of Food in the Last Year: Not on file  Transportation Needs:   . Lack of Transportation (Medical): Not on file  . Lack of Transportation (Non-Medical): Not on file  Physical Activity:   . Days of Exercise per Week: Not on file  . Minutes of Exercise per Session: Not on file  Stress:   . Feeling of Stress : Not on file  Social Connections:   . Frequency of Communication with Friends and Family: Not on file  . Frequency of Social Gatherings with Friends and Family: Not on file  . Attends Religious Services: Not on file  . Active Member of Clubs  or Organizations: Not on file  . Attends Archivist Meetings: Not on file  . Marital Status: Not on file  Intimate Partner Violence:   . Fear of Current or Ex-Partner: Not on file  . Emotionally Abused: Not on file  . Physically Abused: Not on file  . Sexually Abused: Not on file   Family History  Problem Relation Age of Onset  . Hypertension Mother   . Vision loss Mother   . Atrial fibrillation Mother   . Heart disease Father   . Thyroid disease Father   . Hypertension Sister   . Atrial fibrillation Sister     Objective: Office vital signs reviewed. BP 137/78   Pulse 80   Temp 97.6 F (36.4 C)   Ht 5' 6"  (1.676 m)   Wt 168 lb 6.4 oz (76.4 kg)   SpO2 96%   BMI 27.18 kg/m   Physical Examination:  General: Awake, alert, well  nourished, No acute distress HEENT: Normal, sclera white, MMM; TMs intact bilaterally. No erythema or bulging. Oropharynx with cobblestone appearance. Cardio: regular rate and rhythm, S1S2 heard, no murmurs appreciated Pulm: clear to auscultation bilaterally, no wheezes, rhonchi or rales; normal work of breathing on room air Extremities: warm, well perfused, No edema, cyanosis or clubbing; +2 pulses bilaterally MSK: normal gait and station  Assessment/ Plan: 74 y.o. male   1. Mixed hyperlipidemia Check fasting labs. Continue statin - CMP14+EGFR - Lipid Panel - TSH - atorvastatin (LIPITOR) 10 MG tablet; Take 1 tablet (10 mg total) by mouth daily.  Dispense: 90 tablet; Refill: 3  2. Benign prostatic hyperplasia without lower urinary tract symptoms Stable. Check PSA - PSA - finasteride (PROSCAR) 5 MG tablet; Take 1 tablet (5 mg total) by mouth daily.  Dispense: 90 tablet; Refill: 3 - tamsulosin (FLOMAX) 0.4 MG CAPS capsule; Take 1 capsule (0.4 mg total) by mouth daily.  Dispense: 90 capsule; Refill: 3  3. Gastroesophageal reflux disease without esophagitis Stable. PPI renewed - omeprazole (PRILOSEC) 20 MG capsule; Take 1 capsule (20 mg total) by mouth daily.  Dispense: 90 capsule; Refill: 3  4. Elevated MCV - CBC  5. Vitamin D deficiency - VITAMIN D 25 Hydroxy (Vit-D Deficiency, Fractures)  6. Allergic reaction to insect bite As needed use. No active reaction - triamcinolone cream (KENALOG) 0.1 %; Apply 1 application topically 2 (two) times daily. x10 days (if needed for allergic rash)  Dispense: 45 g; Refill: 0  7. Rhinosinusitis Suspect allergy mediated. I have given him Raymond pocket prescription for Augmentin should symptoms not improve with current OTC regimen and addition of Astelin. Will follow as needed on this issue - azelastine (ASTELIN) 0.1 % nasal spray; Place 1 spray into both nostrils 2 (two) times daily.  Dispense: 30 mL; Refill: 0 - amoxicillin-clavulanate (AUGMENTIN)  875-125 MG tablet; Take 1 tablet by mouth 2 (two) times daily.  Dispense: 20 tablet; Refill: 0  8. Need for immunization against influenza Administered during today's visit - Flu Vaccine QUAD High Dose(Fluad)   No orders of the defined types were placed in this encounter.  No orders of the defined types were placed in this encounter.    Janora Norlander, DO [place] [phone]

## 2020-05-24 ENCOUNTER — Other Ambulatory Visit: Payer: Self-pay | Admitting: Family Medicine

## 2020-05-24 ENCOUNTER — Telehealth: Payer: Self-pay | Admitting: Family Medicine

## 2020-05-24 LAB — LIPID PANEL
Chol/HDL Ratio: 3.3 ratio (ref 0.0–5.0)
Cholesterol, Total: 140 mg/dL (ref 100–199)
HDL: 42 mg/dL (ref >39)
LDL Chol Calc (NIH): 72 mg/dL (ref 0–99)
Triglycerides: 149 mg/dL (ref 0–149)
VLDL Cholesterol Cal: 26 mg/dL (ref 5–40)

## 2020-05-24 LAB — CMP14+EGFR
ALT: 25 IU/L (ref 0–44)
AST: 23 IU/L (ref 0–40)
Albumin/Globulin Ratio: 1.8 (ref 1.2–2.2)
Albumin: 4.1 g/dL (ref 3.7–4.7)
Alkaline Phosphatase: 115 IU/L (ref 44–121)
BUN/Creatinine Ratio: 14 (ref 10–24)
BUN: 14 mg/dL (ref 8–27)
Bilirubin Total: 0.8 mg/dL (ref 0.0–1.2)
CO2: 22 mmol/L (ref 20–29)
Calcium: 9.3 mg/dL (ref 8.6–10.2)
Chloride: 102 mmol/L (ref 96–106)
Creatinine, Ser: 0.98 mg/dL (ref 0.76–1.27)
GFR calc Af Amer: 88 mL/min/{1.73_m2} (ref >59)
GFR calc non Af Amer: 76 mL/min/{1.73_m2} (ref >59)
Globulin, Total: 2.3 g/dL (ref 1.5–4.5)
Glucose: 101 mg/dL — ABNORMAL HIGH (ref 65–99)
Potassium: 3.8 mmol/L (ref 3.5–5.2)
Sodium: 139 mmol/L (ref 134–144)
Total Protein: 6.4 g/dL (ref 6.0–8.5)

## 2020-05-24 LAB — VITAMIN D 25 HYDROXY (VIT D DEFICIENCY, FRACTURES): Vit D, 25-Hydroxy: 22.1 ng/mL — ABNORMAL LOW (ref 30.0–100.0)

## 2020-05-24 LAB — CBC
Hematocrit: 41.9 % (ref 37.5–51.0)
Hemoglobin: 14.3 g/dL (ref 13.0–17.7)
MCH: 32.6 pg (ref 26.6–33.0)
MCHC: 34.1 g/dL (ref 31.5–35.7)
MCV: 96 fL (ref 79–97)
Platelets: 273 10*3/uL (ref 150–450)
RBC: 4.38 x10E6/uL (ref 4.14–5.80)
RDW: 12.2 % (ref 11.6–15.4)
WBC: 7.3 10*3/uL (ref 3.4–10.8)

## 2020-05-24 LAB — PSA: Prostate Specific Ag, Serum: 0.4 ng/mL (ref 0.0–4.0)

## 2020-05-24 LAB — TSH: TSH: 3.08 u[IU]/mL (ref 0.450–4.500)

## 2020-05-24 MED ORDER — CHOLECALCIFEROL 1.25 MG (50000 UT) PO CAPS: 50000.0000 [IU] | ORAL_CAPSULE | ORAL | 0 refills | Status: AC

## 2020-05-24 NOTE — Telephone Encounter (Signed)
PATIENT AWARE OF LAB RESULTS °

## 2020-11-20 ENCOUNTER — Ambulatory Visit (INDEPENDENT_AMBULATORY_CARE_PROVIDER_SITE_OTHER): Payer: Medicare Other

## 2020-11-20 ENCOUNTER — Encounter: Payer: Self-pay | Admitting: Family Medicine

## 2020-11-20 ENCOUNTER — Ambulatory Visit (INDEPENDENT_AMBULATORY_CARE_PROVIDER_SITE_OTHER): Payer: Medicare Other | Admitting: Family Medicine

## 2020-11-20 ENCOUNTER — Other Ambulatory Visit: Payer: Self-pay

## 2020-11-20 VITALS — BP 148/82 | HR 81 | Temp 98.0°F | Ht 66.0 in | Wt 163.2 lb

## 2020-11-20 DIAGNOSIS — E782 Mixed hyperlipidemia: Secondary | ICD-10-CM

## 2020-11-20 DIAGNOSIS — M792 Neuralgia and neuritis, unspecified: Secondary | ICD-10-CM | POA: Diagnosis not present

## 2020-11-20 DIAGNOSIS — J310 Chronic rhinitis: Secondary | ICD-10-CM

## 2020-11-20 DIAGNOSIS — J329 Chronic sinusitis, unspecified: Secondary | ICD-10-CM | POA: Diagnosis not present

## 2020-11-20 DIAGNOSIS — M4722 Other spondylosis with radiculopathy, cervical region: Secondary | ICD-10-CM | POA: Diagnosis not present

## 2020-11-20 DIAGNOSIS — M25511 Pain in right shoulder: Secondary | ICD-10-CM

## 2020-11-20 MED ORDER — CELECOXIB 100 MG PO CAPS: 100.0000 mg | ORAL_CAPSULE | Freq: Two times a day (BID) | ORAL | 0 refills | Status: DC | PRN

## 2020-11-20 MED ORDER — AZELASTINE HCL 0.1 % NA SOLN: 1.0000 | Freq: Two times a day (BID) | NASAL | 99 refills | Status: DC

## 2020-11-20 NOTE — Patient Instructions (Signed)
You have prescribed a nonsteroidal anti-inflammatory drug (NSAID) today. This will help with your pain and inflammation. Please do not take any other NSAIDs (ibuprofen/Motrin/Advil, naproxen/Aleve, meloxicam/Mobic, Voltaren/diclofenac). Please make sure to eat a meal when taking this medication.   Caution:  If you have a history of acid reflux/indigestion, I recommend that you take an antacid (such as Prilosec, Prevacid) daily while on the NSAID.  If you have a history of bleeding disorder, gastric ulcer, are on a blood thinner (like warfarin/Coumadin, Xarelto, Eliquis, etc) please do not take NSAID.  If you have ever had a heart attack, you should not take NSAIDs.

## 2020-11-20 NOTE — Progress Notes (Signed)
Subjective: CC: Hyperlipidemia, right shoulder pain, rhinorrhea PCP: Janora Norlander, DO HFW:YOVZC Raymond Morales is a 75 y.o. male presenting to clinic today for:  1.  Hyperlipidemia Patient is compliant with Lipitor 10 mg daily.  No reports of chest pain, shortness of breath or abdominal pain.  2.  Right shoulder pain Patient reports that he has been having 6 weeks of right-sided shoulder pain.  Shoulder pain seems to radiate down into the right upper extremity at various places but never at the same time.  He points to the triceps, forearm and anterior shoulder as areas of pain.  He admits to intermittent electric sensation in the right upper extremity as well.  Sometimes he feels that when he is looking down at his phone using his right pointer finger his tip of his finger will go numb but this is not always the case.  Symptoms seem to be more evident when he is attempting to push something away.  He has no problem with abduction or adduction.  He is used lidocaine patches and cream but has not found these to be especially helpful.  Pain is reproducible in the triceps region upon touching the area.  He does have a history of cranial injury that required stitches and he had subsequent compression fractures noted of thoracic spine a couple of years ago.  Has not had any imaging since that time.  3.  Allergic rhinitis Patient reports good control symptoms with Astelin is asking for refills today.  He is compliant with Claritin as well.   ROS: Per HPI  Allergies  Allergen Reactions  . Latex     Other reaction(s): Unknown   Past Medical History:  Diagnosis Date  . Allergy    seasonal  . BPH (benign prostatic hypertrophy)   . Hyperlipidemia     Current Outpatient Medications:  .  atorvastatin (LIPITOR) 10 MG tablet, Take 1 tablet (10 mg total) by mouth daily., Disp: 90 tablet, Rfl: 3 .  finasteride (PROSCAR) 5 MG tablet, Take 1 tablet (5 mg total) by mouth daily., Disp: 90 tablet, Rfl:  3 .  loratadine (CLARITIN) 10 MG tablet, Take 10 mg by mouth daily., Disp: , Rfl:  .  omeprazole (PRILOSEC) 20 MG capsule, Take 1 capsule (20 mg total) by mouth daily., Disp: 90 capsule, Rfl: 3 .  tamsulosin (FLOMAX) 0.4 MG CAPS capsule, Take 1 capsule (0.4 mg total) by mouth daily., Disp: 90 capsule, Rfl: 3 .  triamcinolone cream (KENALOG) 0.1 %, Apply 1 application topically 2 (two) times daily. x10 days (if needed for allergic rash), Disp: 45 g, Rfl: 0 .  azelastine (ASTELIN) 0.1 % nasal spray, Place 1 spray into both nostrils 2 (two) times daily. (Patient not taking: Reported on 11/20/2020), Disp: 30 mL, Rfl: 0 Social History   Socioeconomic History  . Marital status: Married    Spouse name: Not on file  . Number of children: Not on file  . Years of education: Not on file  . Highest education level: Not on file  Occupational History  . Not on file  Tobacco Use  . Smoking status: Never Smoker  . Smokeless tobacco: Never Used  Vaping Use  . Vaping Use: Never used  Substance and Sexual Activity  . Alcohol use: No  . Drug use: No  . Sexual activity: Never  Other Topics Concern  . Not on file  Social History Narrative  . Not on file   Social Determinants of Health   Financial Resource Strain:  Not on file  Food Insecurity: Not on file  Transportation Needs: Not on file  Physical Activity: Not on file  Stress: Not on file  Social Connections: Not on file  Intimate Partner Violence: Not on file   Family History  Problem Relation Age of Onset  . Hypertension Mother   . Vision loss Mother   . Atrial fibrillation Mother   . Heart disease Father   . Thyroid disease Father   . Hypertension Sister   . Atrial fibrillation Sister     Objective: Office vital signs reviewed. BP (!) 148/82   Pulse 81   Temp 98 F (36.7 C) (Temporal)   Ht 5\' 6"  (1.676 m)   Wt 163 lb 3.2 oz (74 kg)   SpO2 97%   BMI 26.34 kg/m   Physical Examination:  General: Awake, alert, well  nourished, No acute distress HEENT: Normal; sclera white.  Moist mucous membranes. Cardio: regular rate and rhythm, S1S2 heard, no murmurs appreciated Pulm: clear to auscultation bilaterally, no wheezes, rhonchi or rales; normal work of breathing on room air MSK: Normal gait and station  C-spine: Range of motion appears to be preserved.  Strength is preserved as below  Right shoulder: Tenderness palpation along the anterior right shoulder.  Negative empty can. Skin: dry; intact; no rashes or lesions Neuro: 5/5 UE Strength and light touch sensation grossly intact   Assessment/ Plan: 75 y.o. male   Acute pain of right shoulder - Plan: celecoxib (CELEBREX) 100 MG capsule, DG Shoulder Right  Radicular pain in right arm - Plan: celecoxib (CELEBREX) 100 MG capsule, DG Cervical Spine 2 or 3 views  Rhinosinusitis - Plan: azelastine (ASTELIN) 0.1 % nasal spray  Mixed hyperlipidemia  I actually think this is more likely coming from his neck.  He has known history of trauma and subsequent injury in this area.  The symptoms that he described in his right upper extremity are radicular in nature.  His shoulder testing was unremarkable.  We will plan for plain films of both the neck and shoulder.  Unfortunately this was not available during his time slot but he will return later in the afternoon to obtain these.  I have ordered his Astelin nasal spray.  Continue statin for cholesterol control  No orders of the defined types were placed in this encounter.  No orders of the defined types were placed in this encounter.    Janora Norlander, DO [place] [phone]

## 2020-12-04 ENCOUNTER — Other Ambulatory Visit: Payer: Self-pay

## 2020-12-04 ENCOUNTER — Encounter: Payer: Self-pay | Admitting: Physical Therapy

## 2020-12-04 ENCOUNTER — Ambulatory Visit: Payer: Medicare Other | Attending: Family Medicine | Admitting: Physical Therapy

## 2020-12-04 DIAGNOSIS — M25511 Pain in right shoulder: Secondary | ICD-10-CM | POA: Diagnosis not present

## 2020-12-04 DIAGNOSIS — G8929 Other chronic pain: Secondary | ICD-10-CM | POA: Diagnosis not present

## 2020-12-04 DIAGNOSIS — M545 Low back pain, unspecified: Secondary | ICD-10-CM | POA: Insufficient documentation

## 2020-12-04 DIAGNOSIS — M5412 Radiculopathy, cervical region: Secondary | ICD-10-CM | POA: Insufficient documentation

## 2020-12-04 NOTE — Patient Instructions (Signed)
Access Code: [email or web address] URL: [email or web address] Date: 12/04/2020 Prepared by: Almyra Free  Exercises Seated Cervical Retraction - 3 x daily - 7 x weekly - 10 reps - 1 sets - 3-5 sec hold [place] Stretch - 1 x daily - 7 x weekly - 3 reps - 1 sets - 30-60 sec hold
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
## 2020-12-06 ENCOUNTER — Other Ambulatory Visit: Payer: Self-pay

## 2020-12-06 ENCOUNTER — Ambulatory Visit: Payer: Medicare Other | Admitting: Physical Therapy

## 2020-12-06 DIAGNOSIS — M545 Low back pain, unspecified: Secondary | ICD-10-CM

## 2020-12-06 DIAGNOSIS — M5412 Radiculopathy, cervical region: Secondary | ICD-10-CM

## 2020-12-06 DIAGNOSIS — M25511 Pain in right shoulder: Secondary | ICD-10-CM

## 2020-12-06 DIAGNOSIS — G8929 Other chronic pain: Secondary | ICD-10-CM | POA: Diagnosis not present

## 2020-12-06 NOTE — Therapy (Signed)
[facility] [place], [place], [address] Phone: [phone]   Fax:  [phone]  Physical Therapy Treatment  Patient Details  Name: Raymond Morales MRN: [id number] Date of Birth: September 03, 1945 Referring Provider (PT): Ronnie Doss, DO   Encounter Date: 12/06/2020   PT End of Session - 12/06/20 1350    Visit Number 2    Date for PT Re-Evaluation 01/15/21    Authorization Type MCR - KX after 15; FOTO every 10th visit    PT Start Time 1349    PT Stop Time 1445    PT Time Calculation (min) 56 min    Activity Tolerance Patient tolerated treatment well    Behavior During Therapy [facility] for tasks assessed/performed           Past Medical History:  Diagnosis Date  . Allergy    seasonal  . BPH (benign prostatic hypertrophy)   . Hyperlipidemia     No past surgical history on file.  There were no vitals filed for this visit.   Subjective Assessment - 12/06/20 1350    Subjective Patient reporting no pain this afternoon. He awoke at 3 am with sharp pain in right arm. Patient was on his mower for 3.5 hours yesterday and last 30 minutes he had pain in the right low back and referred down to ankle.    Pertinent History T2 fracture and lumbar vertebrae compressed 2018, scoliosis (left lumbar)    Diagnostic tests xray: cervical multilevel degeneration; right shoulder neg    Patient Stated Goals get rid of pain    Currently in Pain? No/denies              [facility] PT Assessment - 12/06/20 0001      Palpation   Patella mobility marked tenderness in right gluteals along iliac crest    Spinal mobility pain with UPA mobs to right L1/2, L/23 and stiffness in L3/4, L4/5; also decreased mobility in mid thoracic with some pain around T8/9 on right; cervical mobility very stiff C3/4 to C6/7    Palpation comment increased tone in right lumbar due to scoliotic curve                         [facility] Adult PT Treatment/Exercise -  12/06/20 0001      Exercises   Exercises Lumbar      Lumbar Exercises: Stretches   ITB Stretch Right;1 rep;30 seconds    ITB Stretch Limitations for right lumbar and QL    Other Lumbar Stretch Exercise lateral glide right x 10    Other Lumbar Stretch Exercise attempted left open book for thoracic mobility but irritated right shoulder      Modalities   Modalities Electrical Stimulation;Moist Heat      Moist Heat Therapy   Number Minutes Moist Heat 10 Minutes    Moist Heat Location Cervical;Lumbar Spine      Electrical Stimulation   Electrical Stimulation Location right UT/right lumbar    Electrical Stimulation Action premod    Electrical Stimulation Parameters x 10 min to tolerance    Electrical Stimulation Goals Pain;Tone      Manual Therapy   Manual Therapy Soft tissue mobilization;Joint mobilization    Manual therapy comments Skilled palpation and monitoring of soft tissues during DN    Joint Mobilization to bil lumbar gd III    Soft tissue mobilization to right UT/parascapular muscles and right lumbar  Trigger Point Dry Needling - 12/06/20 0001    Consent Given? Yes    Education Handout Provided Yes    Muscles Treated Head and Neck Upper trapezius;Levator scapulae    Muscles Treated Upper Quadrant Infraspinatus;Latissimus dorsi    Muscles Treated Back/Hip Quadratus lumborum    Dry Needling Comments right    Upper Trapezius Response Twitch reponse elicited;Palpable increased muscle length    Levator Scapulae Response Palpable increased muscle length    Infraspinatus Response Twitch response elicited;Palpable increased muscle length    Latissimus dorsi Response Twitch response elicited;Palpable increased muscle length    Quadratus Lumborum Response Palpable increased muscle length                PT Education - 12/06/20 1442    Education Details DN education and aftercare    Person(s) Educated Patient    Methods Explanation;Demonstration;Handout     Comprehension Verbalized understanding;Returned demonstration            PT Short Term Goals - 12/06/20 1458      PT SHORT TERM GOAL #1   Title Low back pain to be evaluated and goals set.    Status Achieved      PT SHORT TERM GOAL #2   Title Ind with initial HEP    Status On-going             PT Long Term Goals - 12/06/20 1458      Additional Long Term Goals   Additional Long Term Goals Yes      PT LONG TERM GOAL #6   Title Patient to report decreased LBP by 50% or more with ADLS.    Time 6    Period Weeks    Status New                 Plan - 12/06/20 1451    Clinical Impression Statement Patient reporting no pain today upon arrival. Low back and cervical spine were assessed for mobility. Patient with significant limitations in cervical mobility. Lumbar spine is painful with PA mobs to right L1/2 and L3/4 and stiff below that. He has tight right lumbar paraspinals and QL due to scoliotic curve and will benefit from some stretching here. Initial trial of DN had very good response in right UT, infraspinatus and lats. Normal response to estim/heat afterwards.    PT Treatment/Interventions ADLs/Self Care Home Management;Cryotherapy;Electrical Stimulation;Iontophoresis 4mg /ml Dexamethasone;Moist Heat;Traction;Therapeutic activities;Therapeutic exercise;Neuromuscular re-education;Patient/family education;Manual techniques;Dry needling    PT Next Visit Plan cervical ROM, mobs and R UE nerve glides, shoulder strengthening; right lumbar stretching; test hip strength; progress HEP    PT Home Exercise Plan [facility]           Patient will benefit from skilled therapeutic intervention in order to improve the following deficits and impairments:  Decreased range of motion,Pain,Impaired UE functional use,Increased muscle spasms,Impaired flexibility,Postural dysfunction,Decreased strength  Visit Diagnosis: Radiculopathy, cervical region  Acute pain of right shoulder  Chronic  bilateral low back pain without sciatica     Problem List Patient Active Problem List   Diagnosis Date Noted  . GERD (gastroesophageal reflux disease) 11/22/2018  . Closed wedge compression fracture of second thoracic vertebra ([place]) 07/27/2017  . Allergic rhinitis 11/13/2016  . BPH (benign prostatic hyperplasia) 08/06/2015  . HLD (hyperlipidemia) 08/06/2015  . Healthcare maintenance 08/06/2015  . Hemorrhoids, external, thrombosed 11/04/2013    Madelyn Flavors PT 12/06/2020, 3:01 PM  [facility] [place], [place], [address] Phone: [phone]  Fax:  [phone]  Name: Raymond Morales MRN: [id number] Date of Birth: 16-Oct-1945

## 2020-12-06 NOTE — Patient Instructions (Signed)

## 2020-12-12 ENCOUNTER — Other Ambulatory Visit: Payer: Self-pay

## 2020-12-12 ENCOUNTER — Encounter: Payer: Self-pay | Admitting: Physical Therapy

## 2020-12-12 ENCOUNTER — Ambulatory Visit: Payer: Medicare Other | Admitting: Physical Therapy

## 2020-12-12 DIAGNOSIS — M5412 Radiculopathy, cervical region: Secondary | ICD-10-CM

## 2020-12-12 DIAGNOSIS — M25511 Pain in right shoulder: Secondary | ICD-10-CM

## 2020-12-12 DIAGNOSIS — G8929 Other chronic pain: Secondary | ICD-10-CM

## 2020-12-12 DIAGNOSIS — M545 Low back pain, unspecified: Secondary | ICD-10-CM | POA: Diagnosis not present

## 2020-12-12 NOTE — Therapy (Signed)
[facility] [place], [place], [address] Phone: [phone]   Fax:  [phone]  Physical Therapy Treatment  Patient Details  Name: Raymond Morales MRN: [id number] Date of Birth: [birth year] Referring Provider (PT): Ronnie Doss, DO   Encounter Date: 12/12/2020   PT End of Session - 12/12/20 1304    Visit Number 3    Date for PT Re-Evaluation 01/15/21    Authorization Type MCR - KX after 15; FOTO Morales 10th visit    Progress Note Due on Visit 10    PT Start Time 1304    PT Stop Time 1350    PT Time Calculation (min) 46 min    Activity Tolerance Patient tolerated treatment well    Behavior During Therapy [facility] for tasks assessed/performed           Past Medical History:  Diagnosis Date  . Allergy    seasonal  . BPH (benign prostatic hypertrophy)   . Hyperlipidemia     History reviewed. No pertinent surgical history.  There were no vitals filed for this visit.   Subjective Assessment - 12/12/20 1301    Subjective Patient reporting no pain this afternoon. Reports less pain following modalities and DN. Moved wood yesterday and have some soreness in low back and down lateral RLE. Muscle knots palpable in R deltoids, UT.    Pertinent History T2 fracture and lumbar vertebrae compressed 2018, scoliosis (left lumbar)    Diagnostic tests xray: cervical multilevel degeneration; right shoulder neg    Patient Stated Goals get rid of pain    Currently in Pain? No/denies              [facility] PT Assessment - 12/12/20 0001      Assessment   Medical Diagnosis radicular pain right arm    Referring Provider (PT) Ronnie Doss, DO    Onset Date/Surgical Date 10/23/20    Hand Dominance Right      Precautions   Precautions None      Restrictions   Weight Bearing Restrictions No                         [facility] Adult PT Treatment/Exercise - 12/12/20 0001      Modalities   Modalities Electrical  Stimulation;Moist Heat      Moist Heat Therapy   Number Minutes Moist Heat 15 Minutes    Moist Heat Location Cervical;Lumbar Spine      Electrical Stimulation   Electrical Stimulation Location right UT/right lumbar    Electrical Stimulation Action Pre-Mod    Electrical Stimulation Parameters 80-150 hz x15 min    Electrical Stimulation Goals Pain;Tone      Manual Therapy   Manual Therapy Soft tissue mobilization    Soft tissue mobilization STW to R UT, cervical paraspinals, mid trap, tricep, deltoids to reduce soreness                    PT Short Term Goals - 12/12/20 1339      PT SHORT TERM GOAL #1   Title Low back pain to be evaluated and goals set.    Status Achieved      PT SHORT TERM GOAL #2   Title Ind with initial HEP    Status Achieved             PT Long Term Goals - 12/12/20 1339      PT LONG TERM GOAL #1   Title  Patient to report decreased right arm pain by 75% or more with piano playing and OH ADLS    Time 6    Period Weeks    Status On-going      PT LONG TERM GOAL #2   Title Patient able to sleep for > 5 hours without waking from pain    Time 6    Period Weeks    Status On-going      PT LONG TERM GOAL #3   Title Patient to demo improved right shoulder ER strength to 4/5 or better.    Time 6    Period Weeks    Status On-going      PT LONG TERM GOAL #4   Title Patient to demonstrate improved neck flexibilty: cervical SB to 12 deg. left rot 45 deg or more    Time 6    Period Weeks    Status On-going      PT LONG TERM GOAL #5   Title Improved FOTO to 66    Baseline 56    Time 6    Period Weeks    Status On-going      PT LONG TERM GOAL #6   Title Patient to report decreased LBP by 50% or more with ADLS.    Time 6    Period Weeks    Status On-going                 Plan - 12/12/20 1341    Clinical Impression Statement Patient presented in clinic with reports of less RUE pain since DN and less lumbar symptoms since  modalities in last session as well. Patient presented with multiple tender areas surrounding R deltoids, triceps, UT and especially tender in R mid trap as well. Intermittant ache reported intermittantly down lateral RLE. Normal modalities response noted following removal of the modalities.    Personal Factors and Comorbidities Comorbidity 2    Comorbidities T2 compression fx, scoliosis    Stability/Clinical Decision Making Evolving/Moderate complexity    Rehab Potential Good    PT Frequency 2x / week    PT Duration 6 weeks    PT Treatment/Interventions ADLs/Self Care Home Management;Cryotherapy;Electrical Stimulation;Iontophoresis 4mg /ml Dexamethasone;Moist Heat;Traction;Therapeutic activities;Therapeutic exercise;Neuromuscular re-education;Patient/family education;Manual techniques;Dry needling    PT Next Visit Plan cervical ROM, mobs and R UE nerve glides, shoulder strengthening; right lumbar stretching; test hip strength; progress HEP    PT Home Exercise Plan [id number]    Consulted and Agree with Plan of Care Patient           Patient will benefit from skilled therapeutic intervention in order to improve the following deficits and impairments:  Decreased range of motion,Pain,Impaired UE functional use,Increased muscle spasms,Impaired flexibility,Postural dysfunction,Decreased strength  Visit Diagnosis: Radiculopathy, cervical region  Acute pain of right shoulder  Chronic bilateral low back pain without sciatica     Problem List Patient Active Problem List   Diagnosis Date Noted  . GERD (gastroesophageal reflux disease) 11/22/2018  . Closed wedge compression fracture of second thoracic vertebra ([place]) 07/27/2017  . Allergic rhinitis 11/13/2016  . BPH (benign prostatic hyperplasia) 08/06/2015  . HLD (hyperlipidemia) 08/06/2015  . Healthcare maintenance 08/06/2015  . Hemorrhoids, external, thrombosed 11/04/2013    Standley Brooking, PTA 12/12/2020, 2:18 PM  [facility] [place], [place], [address] Phone: [phone]   Fax:  [phone]  Name: Raymond Morales MRN: [id number] Date of Birth: 02-04-1946

## 2020-12-14 ENCOUNTER — Ambulatory Visit: Payer: Medicare Other | Admitting: *Deleted

## 2020-12-14 ENCOUNTER — Other Ambulatory Visit: Payer: Self-pay

## 2020-12-14 DIAGNOSIS — M5412 Radiculopathy, cervical region: Secondary | ICD-10-CM

## 2020-12-14 DIAGNOSIS — M25511 Pain in right shoulder: Secondary | ICD-10-CM

## 2020-12-14 DIAGNOSIS — G8929 Other chronic pain: Secondary | ICD-10-CM | POA: Diagnosis not present

## 2020-12-14 DIAGNOSIS — M545 Low back pain, unspecified: Secondary | ICD-10-CM | POA: Diagnosis not present

## 2020-12-14 NOTE — Therapy (Signed)
[facility] [place], [place], [address] Phone: [phone]   Fax:  [phone]  Physical Therapy Treatment  Patient Details  Name: Raymond Morales MRN: [id number] Date of Birth: 1946-04-19 Referring Provider (PT): Ronnie Doss, DO   Encounter Date: 12/14/2020   PT End of Session - 12/14/20 0953    Visit Number 4    Date for PT Re-Evaluation 01/15/21    Authorization Type MCR - KX after 15; FOTO every 10th visit    Progress Note Due on Visit 10    PT Start Time 0900    PT Stop Time 0950    PT Time Calculation (min) 50 min           Past Medical History:  Diagnosis Date  . Allergy    seasonal  . BPH (benign prostatic hypertrophy)   . Hyperlipidemia     No past surgical history on file.  There were no vitals filed for this visit.   Subjective Assessment - 12/14/20 0906    Subjective Patient reporting no pain this afternoon. Reports less pain following modalities and DN.    Pertinent History T2 fracture and lumbar vertebrae compressed 2018, scoliosis (left lumbar)    Diagnostic tests xray: cervical multilevel degeneration; right shoulder neg    Patient Stated Goals get rid of pain                             [facility] Adult PT Treatment/Exercise - 12/14/20 0001      Modalities   Modalities Electrical Stimulation;Moist Heat      Moist Heat Therapy   Number Minutes Moist Heat 15 Minutes    Moist Heat Location Cervical;Lumbar Spine      Electrical Stimulation   Electrical Stimulation Location right UT/right lumbar    Electrical Stimulation Action premod    Electrical Stimulation Parameters 80-150hz  x 15 mins    Electrical Stimulation Goals Pain;Tone      Manual Therapy   Manual Therapy Soft tissue mobilization    Soft tissue mobilization STW / IASTW/ TPR to R UT, cervical paraspinals, mid trap, tricep, deltoids to reduce soreness                    PT Short Term Goals -  12/12/20 1339      PT SHORT TERM GOAL #1   Title Low back pain to be evaluated and goals set.    Status Achieved      PT SHORT TERM GOAL #2   Title Ind with initial HEP    Status Achieved             PT Long Term Goals - 12/12/20 1339      PT LONG TERM GOAL #1   Title Patient to report decreased right arm pain by 75% or more with piano playing and OH ADLS    Time 6    Period Weeks    Status On-going      PT LONG TERM GOAL #2   Title Patient able to sleep for > 5 hours without waking from pain    Time 6    Period Weeks    Status On-going      PT LONG TERM GOAL #3   Title Patient to demo improved right shoulder ER strength to 4/5 or better.    Time 6    Period Weeks    Status On-going  PT LONG TERM GOAL #4   Title Patient to demonstrate improved neck flexibilty: cervical SB to 12 deg. left rot 45 deg or more    Time 6    Period Weeks    Status On-going      PT LONG TERM GOAL #5   Title Improved FOTO to 66    Baseline 56    Time 6    Period Weeks    Status On-going      PT LONG TERM GOAL #6   Title Patient to report decreased LBP by 50% or more with ADLS.    Time 6    Period Weeks    Status On-going                 Plan - 12/14/20 0955    Clinical Impression Statement Pt arrived today reporting feeling better with decreased pain in LB and neck/Rt shldr. Rx focused on TPs and taut tissues in RT UT, and deltoid as well as tricep with good release. We also discussed his sitting and standing posture  and how it affects mm tension. Decreased pain after session.    Comorbidities T2 compression fx, scoliosis    Stability/Clinical Decision Making Evolving/Moderate complexity    Rehab Potential Good    PT Frequency 2x / week    PT Duration 6 weeks    PT Treatment/Interventions ADLs/Self Care Home Management;Cryotherapy;Electrical Stimulation;Iontophoresis 4mg /ml Dexamethasone;Moist Heat;Traction;Therapeutic activities;Therapeutic exercise;Neuromuscular  re-education;Patient/family education;Manual techniques;Dry needling    PT Next Visit Plan cervical ROM, mobs and R UE nerve glides, shoulder strengthening; right lumbar stretching; test hip strength; progress HEP    Consulted and Agree with Plan of Care Patient           Patient will benefit from skilled therapeutic intervention in order to improve the following deficits and impairments:  Decreased range of motion,Pain,Impaired UE functional use,Increased muscle spasms,Impaired flexibility,Postural dysfunction,Decreased strength  Visit Diagnosis: Radiculopathy, cervical region  Acute pain of right shoulder  Chronic bilateral low back pain without sciatica     Problem List Patient Active Problem List   Diagnosis Date Noted  . GERD (gastroesophageal reflux disease) 11/22/2018  . Closed wedge compression fracture of second thoracic vertebra ([place]) 07/27/2017  . Allergic rhinitis 11/13/2016  . BPH (benign prostatic hyperplasia) 08/06/2015  . HLD (hyperlipidemia) 08/06/2015  . Healthcare maintenance 08/06/2015  . Hemorrhoids, external, thrombosed 11/04/2013    Kristofor Michalowski,CHRIS, PTA 12/14/2020, 10:01 AM  [facility] [place], [place], [address] Phone: [phone]   Fax:  [phone]  Name: Uvaldo Rybacki MRN: [id number] Date of Birth: [birth year]

## 2020-12-18 ENCOUNTER — Ambulatory Visit: Payer: Medicare Other | Admitting: *Deleted

## 2020-12-18 ENCOUNTER — Other Ambulatory Visit: Payer: Self-pay

## 2020-12-18 DIAGNOSIS — M25511 Pain in right shoulder: Secondary | ICD-10-CM | POA: Diagnosis not present

## 2020-12-18 DIAGNOSIS — M5412 Radiculopathy, cervical region: Secondary | ICD-10-CM

## 2020-12-18 DIAGNOSIS — G8929 Other chronic pain: Secondary | ICD-10-CM

## 2020-12-18 DIAGNOSIS — M545 Low back pain, unspecified: Secondary | ICD-10-CM | POA: Diagnosis not present

## 2020-12-18 NOTE — Therapy (Signed)
[facility] [place], [place], [address] Phone: [phone]   Fax:  [phone]  Physical Therapy Treatment  Patient Details  Name: Raymond Morales MRN: [id number] Date of Birth: 12-23-1945 Referring Provider (PT): Ronnie Doss, DO   Encounter Date: 12/18/2020   PT End of Session - 12/18/20 0941    Visit Number 5    Number of Visits 12    Date for PT Re-Evaluation 01/15/21    Authorization Type MCR - KX after 15; FOTO every 10th visit    Progress Note Due on Visit 10    PT Start Time 0900    PT Stop Time 0946    PT Time Calculation (min) 46 min           Past Medical History:  Diagnosis Date  . Allergy    seasonal  . BPH (benign prostatic hypertrophy)   . Hyperlipidemia     No past surgical history on file.  There were no vitals filed for this visit.   Subjective Assessment - 12/18/20 0901    Subjective Covid 19 screen performed upon arrival. Did great after last Rx. Pain is intermittent now    Pertinent History T2 fracture and lumbar vertebrae compressed 2018, scoliosis (left lumbar)    Diagnostic tests xray: cervical multilevel degeneration; right shoulder neg    Patient Stated Goals get rid of pain    Currently in Pain? Yes    Pain Score 2     Pain Location Arm    Pain Orientation Right    Pain Descriptors / Indicators Aching;Sore                             [facility] Adult PT Treatment/Exercise - 12/18/20 0001      Modalities   Modalities Electrical Stimulation;Moist Heat      Moist Heat Therapy   Number Minutes Moist Heat 15 Minutes    Moist Heat Location Cervical;Lumbar Spine      Electrical Stimulation   Electrical Stimulation Location right UT/right lumbar    Electrical Stimulation Goals Pain;Tone      Manual Therapy   Manual Therapy Soft tissue mobilization    Soft tissue mobilization STW / IASTW/ TPR to R UT, cervical paraspinals, mid trap, posterior cuff,, tricep,  deltoids to reduce soreness x 25 mins                    PT Short Term Goals - 12/12/20 1339      PT SHORT TERM GOAL #1   Title Low back pain to be evaluated and goals set.    Status Achieved      PT SHORT TERM GOAL #2   Title Ind with initial HEP    Status Achieved             PT Long Term Goals - 12/12/20 1339      PT LONG TERM GOAL #1   Title Patient to report decreased right arm pain by 75% or more with piano playing and OH ADLS    Time 6    Period Weeks    Status On-going      PT LONG TERM GOAL #2   Title Patient able to sleep for > 5 hours without waking from pain    Time 6    Period Weeks    Status On-going      PT LONG TERM GOAL #3   Title Patient  to demo improved right shoulder ER strength to 4/5 or better.    Time 6    Period Weeks    Status On-going      PT LONG TERM GOAL #4   Title Patient to demonstrate improved neck flexibilty: cervical SB to 12 deg. left rot 45 deg or more    Time 6    Period Weeks    Status On-going      PT LONG TERM GOAL #5   Title Improved FOTO to 66    Baseline 56    Time 6    Period Weeks    Status On-going      PT LONG TERM GOAL #6   Title Patient to report decreased LBP by 50% or more with ADLS.    Time 6    Period Weeks    Status On-going                 Plan - 12/18/20 7989    Clinical Impression Statement Pt arrived today feeling 30% better overall and reports performing exs at home and Rxs here are helping. Pt had several notable TPs today with good releases as well with decreased pain end of session.    Personal Factors and Comorbidities Comorbidity 2    Comorbidities T2 compression fx, scoliosis    Stability/Clinical Decision Making Evolving/Moderate complexity    PT Frequency 2x / week    PT Duration 6 weeks    PT Treatment/Interventions ADLs/Self Care Home Management;Cryotherapy;Electrical Stimulation;Iontophoresis 4mg /ml Dexamethasone;Moist Heat;Traction;Therapeutic  activities;Therapeutic exercise;Neuromuscular re-education;Patient/family education;Manual techniques;Dry needling    PT Next Visit Plan cervical ROM, mobs and R UE nerve glides, shoulder strengthening; right lumbar stretching; test hip strength; progress HEP    Consulted and Agree with Plan of Care Patient           Patient will benefit from skilled therapeutic intervention in order to improve the following deficits and impairments:  Decreased range of motion,Pain,Impaired UE functional use,Increased muscle spasms,Impaired flexibility,Postural dysfunction,Decreased strength  Visit Diagnosis: Radiculopathy, cervical region  Acute pain of right shoulder  Chronic bilateral low back pain without sciatica     Problem List Patient Active Problem List   Diagnosis Date Noted  . GERD (gastroesophageal reflux disease) 11/22/2018  . Closed wedge compression fracture of second thoracic vertebra ([place]) 07/27/2017  . Allergic rhinitis 11/13/2016  . BPH (benign prostatic hyperplasia) 08/06/2015  . HLD (hyperlipidemia) 08/06/2015  . Healthcare maintenance 08/06/2015  . Hemorrhoids, external, thrombosed 11/04/2013    Niketa Turner,CHRIS , PTA 12/18/2020, 1:21 PM  [facility] [place], [place], [address] Phone: [phone]   Fax:  [phone]  Name: Raymond Morales MRN: [id number] Date of Birth: [birth year]

## 2020-12-20 ENCOUNTER — Encounter: Payer: Self-pay | Admitting: Physical Therapy

## 2020-12-20 ENCOUNTER — Ambulatory Visit: Payer: Medicare Other | Admitting: Physical Therapy

## 2020-12-20 ENCOUNTER — Other Ambulatory Visit: Payer: Self-pay

## 2020-12-20 DIAGNOSIS — M25511 Pain in right shoulder: Secondary | ICD-10-CM

## 2020-12-20 DIAGNOSIS — M5412 Radiculopathy, cervical region: Secondary | ICD-10-CM | POA: Diagnosis not present

## 2020-12-20 DIAGNOSIS — G8929 Other chronic pain: Secondary | ICD-10-CM

## 2020-12-20 DIAGNOSIS — M545 Low back pain, unspecified: Secondary | ICD-10-CM | POA: Diagnosis not present

## 2020-12-24 NOTE — Therapy (Signed)
[facility] [place], [place], [address] Phone: [phone]   Fax:  [phone]  Physical Therapy Treatment  Patient Details  Name: Raymond Morales MRN: [id number] Date of Birth: 1945/12/12 Referring Provider (PT): Ronnie Doss, DO   Encounter Date: 12/20/2020   PT End of Session - 12/24/20 0813    Visit Number 6    Number of Visits 12    Date for PT Re-Evaluation 01/15/21    Authorization Type MCR - KX after 15; FOTO every 10th visit    Progress Note Due on Visit 10    Activity Tolerance Patient tolerated treatment well    Behavior During Therapy [facility] for tasks assessed/performed           Past Medical History:  Diagnosis Date  . Allergy    seasonal  . BPH (benign prostatic hypertrophy)   . Hyperlipidemia     History reviewed. No pertinent surgical history.  There were no vitals filed for this visit.   Subjective Assessment - 12/24/20 0813    Subjective Covid 19 screen performed upon arrival. Reports waking in the night due to spasm of TP in anterior shoulder.    Pertinent History T2 fracture and lumbar vertebrae compressed 2018, scoliosis (left lumbar)    Diagnostic tests xray: cervical multilevel degeneration; right shoulder neg    Patient Stated Goals get rid of pain    Pain Onset More than a month ago              [facility] PT Assessment - 12/24/20 0001      Assessment   Medical Diagnosis radicular pain right arm    Referring Provider (PT) Ronnie Doss, DO    Onset Date/Surgical Date 10/23/20    Hand Dominance Right      Precautions   Precautions None      Restrictions   Weight Bearing Restrictions No                         [facility] Adult PT Treatment/Exercise - 12/24/20 0001      Modalities   Modalities Electrical Stimulation;Moist Heat      Moist Heat Therapy   Moist Heat Location Cervical;Lumbar Spine      Electrical Stimulation   Electrical Stimulation Location  right UT, deltoids/B SI joint    Electrical Stimulation Goals Pain;Tone      Manual Therapy   Manual Therapy Soft tissue mobilization    Soft tissue mobilization STW / IASTW/ TPR to R tricep, deltoids, bicep to reduce soreness and TPs                    PT Short Term Goals - 12/12/20 1339      PT SHORT TERM GOAL #1   Title Low back pain to be evaluated and goals set.    Status Achieved      PT SHORT TERM GOAL #2   Title Ind with initial HEP    Status Achieved             PT Long Term Goals - 12/12/20 1339      PT LONG TERM GOAL #1   Title Patient to report decreased right arm pain by 75% or more with piano playing and OH ADLS    Time 6    Period Weeks    Status On-going      PT LONG TERM GOAL #2   Title Patient able to sleep for >  5 hours without waking from pain    Time 6    Period Weeks    Status On-going      PT LONG TERM GOAL #3   Title Patient to demo improved right shoulder ER strength to 4/5 or better.    Time 6    Period Weeks    Status On-going      PT LONG TERM GOAL #4   Title Patient to demonstrate improved neck flexibilty: cervical SB to 12 deg. left rot 45 deg or more    Time 6    Period Weeks    Status On-going      PT LONG TERM GOAL #5   Title Improved FOTO to 66    Baseline 56    Time 6    Period Weeks    Status On-going      PT LONG TERM GOAL #6   Title Patient to report decreased LBP by 50% or more with ADLS.    Time 6    Period Weeks    Status On-going                 Plan - 12/24/20 6503    Clinical Impression Statement Patient presented with continued L deltoids, proximal bicep. Patient able to pinpoint TPs throughout deltoids and periscapular region. Normal modalities response noted following removal of the modalities.    Personal Factors and Comorbidities Comorbidity 2    Comorbidities T2 compression fx, scoliosis    Stability/Clinical Decision Making Evolving/Moderate complexity    Rehab Potential Good     PT Frequency 2x / week    PT Duration 6 weeks    PT Treatment/Interventions ADLs/Self Care Home Management;Cryotherapy;Electrical Stimulation;Iontophoresis 4mg /ml Dexamethasone;Moist Heat;Traction;Therapeutic activities;Therapeutic exercise;Neuromuscular re-education;Patient/family education;Manual techniques;Dry needling    PT Next Visit Plan cervical ROM, mobs and R UE nerve glides, shoulder strengthening; right lumbar stretching; test hip strength; progress HEP    PT Home Exercise Plan [email or web address]    Consulted and Agree with Plan of Care Patient           Patient will benefit from skilled therapeutic intervention in order to improve the following deficits and impairments:  Decreased range of motion,Pain,Impaired UE functional use,Increased muscle spasms,Impaired flexibility,Postural dysfunction,Decreased strength  Visit Diagnosis: Radiculopathy, cervical region  Acute pain of right shoulder  Chronic bilateral low back pain without sciatica     Problem List Patient Active Problem List   Diagnosis Date Noted  . GERD (gastroesophageal reflux disease) 11/22/2018  . Closed wedge compression fracture of second thoracic vertebra ([place]) 07/27/2017  . Allergic rhinitis 11/13/2016  . BPH (benign prostatic hyperplasia) 08/06/2015  . HLD (hyperlipidemia) 08/06/2015  . Healthcare maintenance 08/06/2015  . Hemorrhoids, external, thrombosed 11/04/2013    Standley Brooking, PTA 12/24/2020, 8:16 AM  [facility] [place], [place], [address] Phone: [phone]   Fax:  [phone]  Name: Raymond Morales MRN: [id number] Date of Birth: 10-11-1945

## 2020-12-25 ENCOUNTER — Encounter: Payer: Self-pay | Admitting: Physical Therapy

## 2020-12-25 ENCOUNTER — Ambulatory Visit: Payer: Medicare Other | Attending: Family Medicine | Admitting: Physical Therapy

## 2020-12-25 ENCOUNTER — Other Ambulatory Visit: Payer: Self-pay

## 2020-12-25 DIAGNOSIS — M545 Low back pain, unspecified: Secondary | ICD-10-CM | POA: Diagnosis not present

## 2020-12-25 DIAGNOSIS — M5412 Radiculopathy, cervical region: Secondary | ICD-10-CM | POA: Diagnosis not present

## 2020-12-25 DIAGNOSIS — M25511 Pain in right shoulder: Secondary | ICD-10-CM | POA: Insufficient documentation

## 2020-12-25 DIAGNOSIS — G8929 Other chronic pain: Secondary | ICD-10-CM | POA: Insufficient documentation

## 2020-12-25 NOTE — Patient Instructions (Signed)
Access Code: [email or web address] URL: [email or web address] Date: 12/25/2020 Prepared by: Almyra Free  Exercises Seated Cervical Retraction - 3 x daily - 7 x weekly - 10 reps - 1 sets - 3-5 sec hold Corner Pec Minor Stretch - 1 x daily - 7 x weekly - 3 reps - 1 sets - 30-60 sec hold Seated Cervical Sidebending AROM - 2 x daily - 7 x weekly - 1 sets - 5 reps - 5 sec hold Seated Cervical Rotation AROM - 2 x daily - 7 x weekly - 1 sets - 5 reps - 5 sec hold  Patient Education TENS Unit

## 2020-12-25 NOTE — Therapy (Signed)
[facility] [place], [place], [address] Phone: [phone]   Fax:  [phone]  Physical Therapy Treatment  Patient Details  Name: Raymond Morales MRN: [id number] Date of Birth: 1946/02/28 Referring Provider (PT): Ronnie Doss, DO   Encounter Date: 12/25/2020   PT End of Session - 12/25/20 0902    Visit Number 7    Number of Visits 12    Date for PT Re-Evaluation 01/15/21    Authorization Type MCR - KX after 15; FOTO every 10th visit    Progress Note Due on Visit 10    PT Start Time 0902    PT Stop Time 1000    PT Time Calculation (min) 58 min    Activity Tolerance Patient tolerated treatment well    Behavior During Therapy [facility] for tasks assessed/performed           Past Medical History:  Diagnosis Date  . Allergy    seasonal  . BPH (benign prostatic hypertrophy)   . Hyperlipidemia     History reviewed. No pertinent surgical history.  There were no vitals filed for this visit.   Subjective Assessment - 12/25/20 0903    Subjective Covid 19 screen performed upon arrival. I'm probably the best I've been since I started.    Pertinent History T2 fracture and lumbar vertebrae compressed 2018, scoliosis (left lumbar)    Patient Stated Goals get rid of pain    Currently in Pain? Yes    Pain Score 2     Pain Location Arm    Pain Orientation Right    Pain Descriptors / Indicators Spasm;Discomfort              [facility] PT Assessment - 12/25/20 0001      AROM   Right Shoulder Flexion 150 Degrees    Right Shoulder ABduction 130 Degrees    Cervical - Right Side Bend 8    Cervical - Left Side Bend 13    Cervical - Right Rotation 50    Cervical - Left Rotation 56      Strength   Right Shoulder Extension 5/5    Right Shoulder External Rotation 4/5   still painful   Right Shoulder Horizontal ABduction --                         [facility] Adult PT Treatment/Exercise - 12/25/20 0001      Lumbar  Exercises: Seated   Other Seated Lumbar Exercises cervical diagonals x 10 ea direction, lateral SB 5 sec hold x 5 bil      Electrical Stimulation   Electrical Stimulation Location right ant shoulder, deltoids/B SI joint    Electrical Stimulation Action Premod    Electrical Stimulation Parameters 80-150 Hz x 47mn    Electrical Stimulation Goals Pain;Tone      Manual Therapy   Manual Therapy Soft tissue mobilization    Manual therapy comments Skilled palpation and monitoring of soft tissues during DN            Trigger Point Dry Needling - 12/25/20 0001    Consent Given? Yes    Education Handout Provided Previously provided    Muscles Treated Head and Neck Upper trapezius    Muscles Treated Upper Quadrant Pectoralis major;Pectoralis minor;Deltoid;Brachioradialis    Muscles Treated Wrist/Hand Extensor carpi radialis longus/brevis    Dry Needling Comments right    Upper Trapezius Response Palpable increased muscle length    Pectoralis Major Response  Palpable increased muscle length    Pectoralis Minor Response Palpable increased muscle length    Deltoid Response Twitch response elicited;Palpable increased muscle length    Brachioradialis Response Twitch response elicited;Palpable increased muscle length    Extensor carpi radialis longus/brevis Response Twitch response elicited;Palpable increased muscle length                PT Education - 12/25/20 1249    Education Details HEP progressed    Person(s) Educated Patient    Methods Explanation;Demonstration;Handout    Comprehension Verbalized understanding;Returned demonstration            PT Short Term Goals - 12/12/20 1339      PT SHORT TERM GOAL #1   Title Low back pain to be evaluated and goals set.    Status Achieved      PT SHORT TERM GOAL #2   Title Ind with initial HEP    Status Achieved             PT Long Term Goals - 12/25/20 0904      PT LONG TERM GOAL #1   Title Patient to report decreased  right arm pain by 75% or more with piano playing and OH ADLS    Baseline 27% improvement (duration and intensity have both decreased)    Status Achieved      PT LONG TERM GOAL #2   Title Patient able to sleep for > 5 hours without waking from pain    Status Partially Met      PT LONG TERM GOAL #3   Title Patient to demo improved right shoulder ER strength to 4/5 or better.    Status Achieved      PT LONG TERM GOAL #4   Baseline SB L 13 deg; SB R 8 deg; L rot 50 deg R rot 56 deg    Status Partially Met      PT LONG TERM GOAL #6   Title Patient to report decreased LBP by 50% or more with ADLS.    Status On-going                 Plan - 12/25/20 1131    Clinical Impression Statement Patient is progressing well toward LTGs. He has improved cervical ROM, shoulder ROM and strength and decreased pain overall in the neck and shoulder. His low back has not improved significantly. He still has pain with lifting and will benefit from Arts development officer education.    PT Frequency 2x / week    PT Duration 6 weeks    PT Treatment/Interventions ADLs/Self Care Home Management;Cryotherapy;Electrical Stimulation;Iontophoresis 40m/ml Dexamethasone;Moist Heat;Traction;Therapeutic activities;Therapeutic exercise;Neuromuscular re-education;Patient/family education;Manual techniques;Dry needling    PT Next Visit Plan body mechanics and lifting    PT Home Exercise Plan [facility]          Patient will benefit from skilled therapeutic intervention in order to improve the following deficits and impairments:  Decreased range of motion,Pain,Impaired UE functional use,Increased muscle spasms,Impaired flexibility,Postural dysfunction,Decreased strength  Visit Diagnosis: Radiculopathy, cervical region  Acute pain of right shoulder  Chronic bilateral low back pain without sciatica     Problem List Patient Active Problem List   Diagnosis Date Noted  . GERD (gastroesophageal reflux disease) 11/22/2018   . Closed wedge compression fracture of second thoracic vertebra ([place] 07/27/2017  . Allergic rhinitis 11/13/2016  . BPH (benign prostatic hyperplasia) 08/06/2015  . HLD (hyperlipidemia) 08/06/2015  . Healthcare maintenance 08/06/2015  . Hemorrhoids, external, thrombosed 11/04/2013  Madelyn Flavors PT 12/25/2020, 12:58 PM  [facility] [address] [place], [place], [address] Phone: [phone]   Fax:  [phone]  Name: Estaban Mainville MRN: [id number] Date of Birth: 1945-10-23

## 2020-12-27 ENCOUNTER — Ambulatory Visit: Payer: Medicare Other | Admitting: Physical Therapy

## 2020-12-27 ENCOUNTER — Other Ambulatory Visit: Payer: Self-pay

## 2020-12-27 DIAGNOSIS — M5412 Radiculopathy, cervical region: Secondary | ICD-10-CM | POA: Diagnosis not present

## 2020-12-27 DIAGNOSIS — G8929 Other chronic pain: Secondary | ICD-10-CM | POA: Diagnosis not present

## 2020-12-27 DIAGNOSIS — M25511 Pain in right shoulder: Secondary | ICD-10-CM

## 2020-12-27 DIAGNOSIS — M545 Low back pain, unspecified: Secondary | ICD-10-CM | POA: Diagnosis not present

## 2020-12-27 NOTE — Patient Instructions (Addendum)
  Access Code: [email or web address] URL: [email or web address] Date: 12/27/2020 Prepared by: Almyra Free  Exercises Seated Cervical Retraction - 3 x daily - 7 x weekly - 10 reps - 1 sets - 3-5 sec hold Corner Pec Minor Stretch - 1 x daily - 7 x weekly - 3 reps - 1 sets - 30-60 sec hold Seated Cervical Sidebending AROM - 2 x daily - 7 x weekly - 1 sets - 5 reps - 5 sec hold Seated Cervical Rotation AROM - 2 x daily - 7 x weekly - 1 sets - 5 reps - 5 sec hold Squat with Chair Touch - 1 x daily - 7 x weekly - 1 sets - 10 reps Half Deadlift with Kettlebell - 1 x daily - 7 x weekly - 1 sets - 10 reps Wall Squat - 1 x daily - 7 x weekly - 1 sets - 10 reps  Patient Education [place] Patient Instructions

## 2020-12-27 NOTE — Therapy (Signed)
[facility] [place], [place], [address] Phone: [phone]   Fax:  [phone]  Physical Therapy Treatment  Patient Details  Name: Raymond Morales MRN: [id number] Date of Birth: 08/16/1946 Referring Provider (PT): Raymond Doss, DO   Encounter Date: 12/27/2020   PT End of Session - 12/27/20 0901    Visit Number 8    Number of Visits 12    Date for PT Re-Evaluation 01/15/21    Authorization Type MCR - KX after 15; FOTO every 10th visit    PT Start Time 0901    PT Stop Time 0945    PT Time Calculation (min) 44 min    Activity Tolerance Patient tolerated treatment well    Behavior During Therapy [facility] for tasks assessed/performed           Past Medical History:  Diagnosis Date  . Allergy    seasonal  . BPH (benign prostatic hypertrophy)   . Hyperlipidemia     No past surgical history on file.  There were no vitals filed for this visit.   Subjective Assessment - 12/27/20 0901    Subjective Covid 19 screen performed upon arrival. Intermittent sharp pains at top of shoulder in the past 18 hours. Patient reports some lifting of 2x8's from a shelf which he could feel.    Pertinent History T2 fracture and lumbar vertebrae compressed 2018, scoliosis (left lumbar)    Diagnostic tests xray: cervical multilevel degeneration; right shoulder neg    Patient Stated Goals get rid of pain    Currently in Pain? Yes    Pain Score 2     Pain Location Shoulder    Pain Orientation Right    Pain Descriptors / Indicators Raymond Morales Adult PT Treatment/Exercise - 12/27/20 0001      Therapeutic Activites    Therapeutic Activities Lifting    Lifting worked on Midwife including squatting and deadlift form; functional squats tapping mat x 10, deadlifts x 10 no wt; then with 8# wt x 10; 14# box x 10; carry x 10 ft      Modalities   Modalities Electrical Stimulation;Moist  Heat;Iontophoresis      Moist Heat Therapy   Number Minutes Moist Heat 10 Minutes    Moist Heat Location Lumbar Spine      Electrical Stimulation   Electrical Stimulation Location bil lumbar    Electrical Stimulation Action premod    Electrical Stimulation Parameters x82mn to tolerance    Electrical Stimulation Goals Pain      Iontophoresis   Type of Iontophoresis Dexamethasone    Location right anteior shoulder    Dose 1.0 ml    Time 4 hour patch                  PT Education - 12/27/20 0932    Education Details HEP progressed; iontophoresis education and precautions; body mechanics/lifting    Person(s) Educated Patient    Methods Explanation;Handout    Comprehension Verbalized understanding;Returned demonstration            PT Short Term Goals - 12/12/20 1339      PT SHORT TERM GOAL #1   Title Low back pain to be evaluated and goals set.    Status Achieved      PT SHORT TERM GOAL #2  Title Ind with initial HEP    Status Achieved             PT Long Term Goals - 12/25/20 0904      PT LONG TERM GOAL #1   Title Patient to report decreased right arm pain by 75% or more with piano playing and OH ADLS    Baseline 22% improvement (duration and intensity have both decreased)    Status Achieved      PT LONG TERM GOAL #2   Title Patient able to sleep for > 5 hours without waking from pain    Status Partially Met      PT LONG TERM GOAL #3   Title Patient to demo improved right shoulder ER strength to 4/5 or better.    Status Achieved      PT LONG TERM GOAL #4   Baseline SB L 13 deg; SB R 8 deg; L rot 50 deg R rot 56 deg    Status Partially Met      PT LONG TERM GOAL #6   Title Patient to report decreased LBP by 50% or more with ADLS.    Status On-going                 Plan - 12/27/20 1238    Clinical Impression Statement Patient reporting improvement in arm pain since last visit. He still has marked pain with palpation at ant right  shoulder so initial trial of iontophoresis was applied. 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 estim applied to low back at end of session as patient had pain at bil PSIS.    PT Treatment/Interventions ADLs/Self Care Home Management;Cryotherapy;Electrical Stimulation;Iontophoresis 58m/ml Dexamethasone;Moist Heat;Traction;Therapeutic activities;Therapeutic exercise;Neuromuscular re-education;Patient/family education;Manual techniques;Dry needling    PT Next Visit Plan review body mechanics and lifting again, assess ionto    PT Home Exercise Plan [facility]          Patient will benefit from skilled therapeutic intervention in order to improve the following deficits and impairments:  Decreased range of motion,Pain,Impaired UE functional use,Increased muscle spasms,Impaired flexibility,Postural dysfunction,Decreased strength  Visit Diagnosis: Acute pain of right shoulder  Radiculopathy, cervical region  Chronic bilateral low back pain without sciatica     Problem List Patient Active Problem List   Diagnosis Date Noted  . GERD (gastroesophageal reflux disease) 11/22/2018  . Closed wedge compression fracture of second thoracic vertebra ([place] 07/27/2017  . Allergic rhinitis 11/13/2016  . BPH (benign prostatic hyperplasia) 08/06/2015  . HLD (hyperlipidemia) 08/06/2015  . Healthcare maintenance 08/06/2015  . Hemorrhoids, external, thrombosed 11/04/2013   JMadelyn FlavorsPT 12/27/2020, 12:46 PM  [facility] [address] [address]Phone: [phone]  Fax:  [phone] Name: DBranston HalstedMRN: [id number] of Birth: [birth year]

## 2021-01-01 ENCOUNTER — Encounter: Payer: Self-pay | Admitting: Physical Therapy

## 2021-01-01 ENCOUNTER — Other Ambulatory Visit: Payer: Self-pay

## 2021-01-01 ENCOUNTER — Ambulatory Visit: Payer: Medicare Other | Admitting: Physical Therapy

## 2021-01-01 DIAGNOSIS — G8929 Other chronic pain: Secondary | ICD-10-CM | POA: Diagnosis not present

## 2021-01-01 DIAGNOSIS — M25511 Pain in right shoulder: Secondary | ICD-10-CM | POA: Diagnosis not present

## 2021-01-01 DIAGNOSIS — M5412 Radiculopathy, cervical region: Secondary | ICD-10-CM | POA: Diagnosis not present

## 2021-01-01 DIAGNOSIS — M545 Low back pain, unspecified: Secondary | ICD-10-CM | POA: Diagnosis not present

## 2021-01-01 NOTE — Therapy (Signed)
[facility] [place], [place], [address] Phone: [phone]   Fax:  [phone]  Physical Therapy Treatment  Patient Details  Name: Raymond Morales MRN: [id number] Date of Birth: 1945-10-29 Referring Provider (PT): Ronnie Doss, DO   Encounter Date: 01/01/2021   PT End of Session - 01/01/21 1150    Visit Number 9    Number of Visits 12    Date for PT Re-Evaluation 01/15/21    Authorization Type MCR - KX after 15; FOTO every 10th visit    Progress Note Due on Visit 10    PT Start Time 1118    PT Stop Time 1201    PT Time Calculation (min) 43 min    Activity Tolerance Patient tolerated treatment well    Behavior During Therapy [facility] for tasks assessed/performed           Past Medical History:  Diagnosis Date  . Allergy    seasonal  . BPH (benign prostatic hypertrophy)   . Hyperlipidemia     History reviewed. No pertinent surgical history.  There were no vitals filed for this visit.   Subjective Assessment - 01/01/21 1117    Subjective Covid 19 screen performed upon arrival. Reports feeling the best in the last few days but overall 80% better. Still having some TPs in R deltoids, bicep region.    Pertinent History T2 fracture and lumbar vertebrae compressed 2018, scoliosis (left lumbar)    Diagnostic tests xray: cervical multilevel degeneration; right shoulder neg    Patient Stated Goals get rid of pain    Currently in Pain? Yes    Pain Location Arm    Pain Orientation Right    Pain Descriptors / Indicators Sharp    Pain Type Acute pain    Pain Onset More than a month ago    Pain Frequency Constant              [facility] PT Assessment - 01/01/21 0001      Assessment   Medical Diagnosis radicular pain right arm    Referring Provider (PT) Ronnie Doss, DO    Onset Date/Surgical Date 10/23/20    Hand Dominance Right      Precautions   Precautions None                         [facility]  Adult PT Treatment/Exercise - 01/01/21 0001      Modalities   Modalities Electrical Stimulation;Moist Heat;Iontophoresis      Moist Heat Therapy   Number Minutes Moist Heat 10 Minutes    Moist Heat Location Lumbar Spine;Shoulder      Electrical Stimulation   Electrical Stimulation Location B low back, R deltoids    Electrical Stimulation Action IFC    Electrical Stimulation Parameters 80-150 hz x10 min    Electrical Stimulation Goals Pain      Iontophoresis   Type of Iontophoresis Dexamethasone    Location right anteior shoulder    Dose 1.0 ml    Time 4 hour patch      Manual Therapy   Manual Therapy Myofascial release    Myofascial Release IASTW to R deltoids, proximal bicep to reduce TPs and tightness                    PT Short Term Goals - 12/12/20 1339      PT SHORT TERM GOAL #1   Title Low back pain to be evaluated  and goals set.    Status Achieved      PT SHORT TERM GOAL #2   Title Ind with initial HEP    Status Achieved             PT Long Term Goals - 12/25/20 0904      PT LONG TERM GOAL #1   Title Patient to report decreased right arm pain by 75% or more with piano playing and OH ADLS    Baseline 29% improvement (duration and intensity have both decreased)    Status Achieved      PT LONG TERM GOAL #2   Title Patient able to sleep for > 5 hours without waking from pain    Status Partially Met      PT LONG TERM GOAL #3   Title Patient to demo improved right shoulder ER strength to 4/5 or better.    Status Achieved      PT LONG TERM GOAL #4   Baseline SB L 13 deg; SB R 8 deg; L rot 50 deg R rot 56 deg    Status Partially Met      PT LONG TERM GOAL #6   Title Patient to report decreased LBP by 50% or more with ADLS.    Status On-going                 Plan - 01/01/21 1145    Clinical Impression Statement Patient presented in clinic with reports of overall improvement with less TPs. Patient did indicate more proximal bicep and  deltoid TPs and sharp sensation. Patient also reported improvement after iontophoresis patch use at last treatment. Normal modalities response noted following removal of the modalities.    Personal Factors and Comorbidities Comorbidity 2    Comorbidities T2 compression fx, scoliosis    Stability/Clinical Decision Making Evolving/Moderate complexity    Rehab Potential Good    PT Frequency 2x / week    PT Duration 6 weeks    PT Treatment/Interventions ADLs/Self Care Home Management;Cryotherapy;Electrical Stimulation;Iontophoresis 53m/ml Dexamethasone;Moist Heat;Traction;Therapeutic activities;Therapeutic exercise;Neuromuscular re-education;Patient/family education;Manual techniques;Dry needling    PT Next Visit Plan review body mechanics and lifting again, assess ionto    PT Home Exercise Plan [id number]    Consulted and Agree with Plan of Care Patient           Patient will benefit from skilled therapeutic intervention in order to improve the following deficits and impairments:  Decreased range of motion,Pain,Impaired UE functional use,Increased muscle spasms,Impaired flexibility,Postural dysfunction,Decreased strength  Visit Diagnosis: Radiculopathy, cervical region  Acute pain of right shoulder  Chronic bilateral low back pain without sciatica     Problem List Patient Active Problem List   Diagnosis Date Noted  . GERD (gastroesophageal reflux disease) 11/22/2018  . Closed wedge compression fracture of second thoracic vertebra ([place] 07/27/2017  . Allergic rhinitis 11/13/2016  . BPH (benign prostatic hyperplasia) 08/06/2015  . HLD (hyperlipidemia) 08/06/2015  . Healthcare maintenance 08/06/2015  . Hemorrhoids, external, thrombosed 11/04/2013    KStandley Brooking PTA 01/01/2021, 12:07 PM  [facility] [address]Phone: [phone]  Fax:  [phone] Name: Raymond MarhefkaMRN: [id number] of Birth:  [birth year]

## 2021-01-03 ENCOUNTER — Ambulatory Visit: Payer: Medicare Other | Admitting: *Deleted

## 2021-01-03 ENCOUNTER — Other Ambulatory Visit: Payer: Self-pay

## 2021-01-03 DIAGNOSIS — G8929 Other chronic pain: Secondary | ICD-10-CM | POA: Diagnosis not present

## 2021-01-03 DIAGNOSIS — M545 Low back pain, unspecified: Secondary | ICD-10-CM

## 2021-01-03 DIAGNOSIS — M5412 Radiculopathy, cervical region: Secondary | ICD-10-CM

## 2021-01-03 DIAGNOSIS — M25511 Pain in right shoulder: Secondary | ICD-10-CM

## 2021-01-03 NOTE — Therapy (Signed)
[facility] [place], [place], [address] Phone: [phone]   Fax:  [phone]  Physical Therapy Treatment  Patient Details  Name: Raymond Morales MRN: [id number] Date of Birth: [birth year] Referring Provider (PT): Ronnie Doss, DO   Encounter Date: 01/03/2021   PT End of Session - 01/03/21 1003    Visit Number 10    Number of Visits 12    Date for PT Re-Evaluation 01/15/21    Authorization Type MCR - KX after 15; FOTO every 10th visit    Progress Note Due on Visit 10    PT Start Time 0900    PT Stop Time 0950    PT Time Calculation (min) 50 min           Past Medical History:  Diagnosis Date  . Allergy    seasonal  . BPH (benign prostatic hypertrophy)   . Hyperlipidemia     No past surgical history on file.  There were no vitals filed for this visit.   Subjective Assessment - 01/03/21 0903    Subjective Covid 19 screen performed upon arrival. Reports feeling the best in the last few days but overall 80% better. Today sore    Pertinent History T2 fracture and lumbar vertebrae compressed 2018, scoliosis (left lumbar)    Diagnostic tests xray: cervical multilevel degeneration; right shoulder neg    Patient Stated Goals get rid of pain    Currently in Pain? Yes    Pain Score 2     Pain Location Arm    Pain Orientation Right    Pain Descriptors / Indicators Sharp    Pain Type Acute pain    Pain Onset More than a month ago                             [facility] Adult PT Treatment/Exercise - 01/03/21 0001      Modalities   Modalities Electrical Stimulation;Moist Heat;Iontophoresis      Moist Heat Therapy   Number Minutes Moist Heat 15 Minutes    Moist Heat Location Lumbar Spine;Shoulder      Electrical Stimulation   Electrical Stimulation Location B low back, R deltoids    Electrical Stimulation Action IFC    Electrical Stimulation Parameters 80-_0  15 mins    Electrical Stimulation  Goals Pain      Iontophoresis   Type of Iontophoresis Dexamethasone    Location right anteior shoulder    Dose 1 ml    Time 4 hour patch      Manual Therapy   Manual Therapy Myofascial release    Myofascial Release IASTW to R deltoids, Tricep and  proximal bicep to reduce TPs and tightness            STW to RT ACJ also.           PT Short Term Goals - 12/12/20 1339      PT SHORT TERM GOAL #1   Title Low back pain to be evaluated and goals set.    Status Achieved      PT SHORT TERM GOAL #2   Title Ind with initial HEP    Status Achieved             PT Long Term Goals - 12/25/20 0904      PT LONG TERM GOAL #1   Title Patient to report decreased right arm pain by 75% or more with piano playing and  OH ADLS    Baseline 98% improvement (duration and intensity have both decreased)    Status Achieved      PT LONG TERM GOAL #2   Title Patient able to sleep for > 5 hours without waking from pain    Status Partially Met      PT LONG TERM GOAL #3   Title Patient to demo improved right shoulder ER strength to 4/5 or better.    Status Achieved      PT LONG TERM GOAL #4   Baseline SB L 13 deg; SB R 8 deg; L rot 50 deg R rot 56 deg    Status Partially Met      PT LONG TERM GOAL #6   Title Patient to report decreased LBP by 50% or more with ADLS.    Status On-going                 Plan - 01/03/21 1005    Clinical Impression Statement Pt arrived today still reporting doing about 80% better, but still with some TPs and pain. Rx focusd on STW on tricep, bicep, as well as posterior deltoid. STW along ACJ as well. IONTO  patch applied to RT ACJ.  Estim and heat end of session.    Personal Factors and Comorbidities Comorbidity 2    Comorbidities T2 compression fx, scoliosis    Rehab Potential Good    PT Frequency 2x / week    PT Duration 6 weeks    PT Treatment/Interventions ADLs/Self Care Home Management;Cryotherapy;Electrical Stimulation;Iontophoresis 56m/ml  Dexamethasone;Moist Heat;Traction;Therapeutic activities;Therapeutic exercise;Neuromuscular re-education;Patient/family education;Manual techniques;Dry needling    PT Next Visit Plan review body mechanics and lifting again, assess ionto           Patient will benefit from skilled therapeutic intervention in order to improve the following deficits and impairments:  Decreased range of motion,Pain,Impaired UE functional use,Increased muscle spasms,Impaired flexibility,Postural dysfunction,Decreased strength  Visit Diagnosis: Radiculopathy, cervical region  Acute pain of right shoulder  Chronic bilateral low back pain without sciatica     Problem List Patient Active Problem List   Diagnosis Date Noted  . GERD (gastroesophageal reflux disease) 11/22/2018  . Closed wedge compression fracture of second thoracic vertebra ([place] 07/27/2017  . Allergic rhinitis 11/13/2016  . BPH (benign prostatic hyperplasia) 08/06/2015  . HLD (hyperlipidemia) 08/06/2015  . Healthcare maintenance 08/06/2015  . Hemorrhoids, external, thrombosed 11/04/2013    Raymond Morales,Raymond Morales, PTA 01/03/2021, 10:11 AM  [facility] [address]Phone: [phone]  Fax:  [phone] Name: Raymond WillhiteMRN: [id number] of Birth: 1June 19, 1947

## 2021-01-07 ENCOUNTER — Other Ambulatory Visit: Payer: Self-pay | Admitting: Family Medicine

## 2021-01-07 DIAGNOSIS — M792 Neuralgia and neuritis, unspecified: Secondary | ICD-10-CM

## 2021-01-07 DIAGNOSIS — M25511 Pain in right shoulder: Secondary | ICD-10-CM

## 2021-01-08 ENCOUNTER — Ambulatory Visit: Payer: Medicare Other | Admitting: Physical Therapy

## 2021-01-08 ENCOUNTER — Other Ambulatory Visit: Payer: Self-pay

## 2021-01-08 ENCOUNTER — Encounter: Payer: Self-pay | Admitting: Physical Therapy

## 2021-01-08 DIAGNOSIS — M25511 Pain in right shoulder: Secondary | ICD-10-CM | POA: Diagnosis not present

## 2021-01-08 DIAGNOSIS — M5412 Radiculopathy, cervical region: Secondary | ICD-10-CM | POA: Diagnosis not present

## 2021-01-08 DIAGNOSIS — M545 Low back pain, unspecified: Secondary | ICD-10-CM

## 2021-01-08 DIAGNOSIS — G8929 Other chronic pain: Secondary | ICD-10-CM | POA: Diagnosis not present

## 2021-01-08 NOTE — Therapy (Signed)
[facility] [place], [place], [address] Phone: [phone]   Fax:  [phone]  Physical Therapy Treatment  Patient Details  Name: Raymond Morales MRN: [id number] Date of Birth: [birth year] Referring Provider (PT): Ronnie Doss, DO   Encounter Date: 01/08/2021   PT End of Session - 01/08/21 0904    Visit Number 11    Number of Visits 12    Date for PT Re-Evaluation 01/15/21    Authorization Type MCR - KX after 15; FOTO every 10th visit    Progress Note Due on Visit 10    PT Start Time 0904    PT Stop Time 0948    PT Time Calculation (min) 44 min    Activity Tolerance Patient tolerated treatment well    Behavior During Therapy [facility] for tasks assessed/performed           Past Medical History:  Diagnosis Date  . Allergy    seasonal  . BPH (benign prostatic hypertrophy)   . Hyperlipidemia     History reviewed. No pertinent surgical history.  There were no vitals filed for this visit.   Subjective Assessment - 01/08/21 0903    Subjective Covid 19 screen performed upon arrival. reports 80% improvement but did yardwork yesterday with weedeating.    Pertinent History T2 fracture and lumbar vertebrae compressed 2018, scoliosis (left lumbar)    Diagnostic tests xray: cervical multilevel degeneration; right shoulder neg    Patient Stated Goals get rid of pain    Currently in Pain? Yes    Pain Score 1     Pain Location Arm    Pain Orientation Right    Pain Descriptors / Indicators Discomfort    Pain Type Acute pain    Pain Onset More than a month ago    Pain Frequency Intermittent              [facility] PT Assessment - 01/08/21 0001      Assessment   Medical Diagnosis radicular pain right arm    Referring Provider (PT) Ronnie Doss, DO    Onset Date/Surgical Date 10/23/20    Hand Dominance Right      Precautions   Precautions None      Restrictions   Weight Bearing Restrictions No                          [facility] Adult PT Treatment/Exercise - 01/08/21 0001      Modalities   Modalities Electrical Stimulation;Moist Heat;Iontophoresis      Moist Heat Therapy   Number Minutes Moist Heat 15 Minutes    Moist Heat Location Lumbar Spine;Shoulder      Electrical Stimulation   Electrical Stimulation Location B low back, R deltoids    Electrical Stimulation Action IFC    Electrical Stimulation Parameters 80-150 hz x15 min    Electrical Stimulation Goals Pain      Iontophoresis   Type of Iontophoresis Dexamethasone    Location right anteior shoulder    Dose 1 ml    Time 4 hour patch      Manual Therapy   Manual Therapy Myofascial release    Myofascial Release IASTW to R deltoids, Tricep and  proximal bicep to reduce TPs and tightness                    PT Short Term Goals - 12/12/20 1339      PT SHORT TERM GOAL #1  Title Low back pain to be evaluated and goals set.    Status Achieved      PT SHORT TERM GOAL #2   Title Ind with initial HEP    Status Achieved             PT Long Term Goals - 12/25/20 0904      PT LONG TERM GOAL #1   Title Patient to report decreased right arm pain by 75% or more with piano playing and OH ADLS    Baseline 01% improvement (duration and intensity have both decreased)    Status Achieved      PT LONG TERM GOAL #2   Title Patient able to sleep for > 5 hours without waking from pain    Status Partially Met      PT LONG TERM GOAL #3   Title Patient to demo improved right shoulder ER strength to 4/5 or better.    Status Achieved      PT LONG TERM GOAL #4   Baseline SB L 13 deg; SB R 8 deg; L rot 50 deg R rot 56 deg    Status Partially Met      PT LONG TERM GOAL #6   Title Patient to report decreased LBP by 50% or more with ADLS.    Status On-going                 Plan - 01/08/21 0953    Clinical Impression Statement Patient continues to present with reports of improvement but reports of TPs  in R deltoid and bicep region. Patient experiencing more soreness and tenderness in R deltoids today with moderate redness response throughout R upper arm due to IASTW. Normal modalities response noted following removal of the modalities. Patient indicates that pain is based on activities that he is doing such as weedeating or other yardwork. Iontophoresis patched donned to R AC joint.    Personal Factors and Comorbidities Comorbidity 2    Comorbidities T2 compression fx, scoliosis    Stability/Clinical Decision Making Evolving/Moderate complexity    Rehab Potential Good    PT Frequency 2x / week    PT Duration 6 weeks    PT Treatment/Interventions ADLs/Self Care Home Management;Cryotherapy;Electrical Stimulation;Iontophoresis 22m/ml Dexamethasone;Moist Heat;Traction;Therapeutic activities;Therapeutic exercise;Neuromuscular re-education;Patient/family education;Manual techniques;Dry needling    PT Next Visit Plan review body mechanics and lifting again, assess ionto    PT Home Exercise Plan [id number]    Consulted and Agree with Plan of Care Patient           Patient will benefit from skilled therapeutic intervention in order to improve the following deficits and impairments:  Decreased range of motion,Pain,Impaired UE functional use,Increased muscle spasms,Impaired flexibility,Postural dysfunction,Decreased strength  Visit Diagnosis: Radiculopathy, cervical region  Acute pain of right shoulder  Chronic bilateral low back pain without sciatica     Problem List Patient Active Problem List   Diagnosis Date Noted  . GERD (gastroesophageal reflux disease) 11/22/2018  . Closed wedge compression fracture of second thoracic vertebra ([place] 07/27/2017  . Allergic rhinitis 11/13/2016  . BPH (benign prostatic hyperplasia) 08/06/2015  . HLD (hyperlipidemia) 08/06/2015  . Healthcare maintenance 08/06/2015  . Hemorrhoids, external, thrombosed 11/04/2013    KStandley Brooking PTA 01/08/2021,  9:58 AM  [facility] [address]Phone: [phone]  Fax:  [phone] Name: DSudais BanghartMRN: [id number] of Birth: 110/15/1947

## 2021-01-10 ENCOUNTER — Other Ambulatory Visit: Payer: Self-pay

## 2021-01-10 ENCOUNTER — Ambulatory Visit: Payer: Medicare Other | Admitting: Physical Therapy

## 2021-01-10 DIAGNOSIS — G8929 Other chronic pain: Secondary | ICD-10-CM | POA: Diagnosis not present

## 2021-01-10 DIAGNOSIS — M545 Low back pain, unspecified: Secondary | ICD-10-CM | POA: Diagnosis not present

## 2021-01-10 DIAGNOSIS — M25511 Pain in right shoulder: Secondary | ICD-10-CM

## 2021-01-10 DIAGNOSIS — M5412 Radiculopathy, cervical region: Secondary | ICD-10-CM | POA: Diagnosis not present

## 2021-01-10 NOTE — Therapy (Signed)
[facility] [place], [place], [address] Phone: [phone]   Fax:  [phone]  Physical Therapy Treatment  Patient Details  Name: Raymond Morales MRN: [id number] Date of Birth: 08-Feb-1946 Referring Provider (PT): Ronnie Doss, DO   Encounter Date: 01/10/2021   PT End of Session - 01/10/21 0905    Visit Number 12    Number of Visits 12    Date for PT Re-Evaluation 01/15/21    Authorization Type MCR - KX after 15; FOTO every 10th visit    PT Start Time 0906    PT Stop Time 0944    PT Time Calculation (min) 38 min    Activity Tolerance Patient tolerated treatment well    Behavior During Therapy [facility] for tasks assessed/performed           Past Medical History:  Diagnosis Date  . Allergy    seasonal  . BPH (benign prostatic hypertrophy)   . Hyperlipidemia     No past surgical history on file.  There were no vitals filed for this visit.   Subjective Assessment - 01/10/21 0930    Subjective Covid 19 screen performed upon arrival. "About the best I've been since starting therapy." Reports mowing a lot yesterday and weedeating.    Pertinent History T2 fracture and lumbar vertebrae compressed 2018, scoliosis (left lumbar)    Diagnostic tests xray: cervical multilevel degeneration; right shoulder neg    Patient Stated Goals get rid of pain    Currently in Pain? Yes    Pain Score 1     Pain Location Arm    Pain Orientation Right;Upper    Pain Descriptors / Indicators Discomfort    Pain Type Acute pain    Pain Onset More than a month ago    Pain Frequency Intermittent              [facility] PT Assessment - 01/10/21 0001      Assessment   Medical Diagnosis radicular pain right arm    Referring Provider (PT) Ronnie Doss, DO    Onset Date/Surgical Date 10/23/20    Hand Dominance Right      Precautions   Precautions None      Restrictions   Weight Bearing Restrictions No      ROM / Strength   AROM /  PROM / Strength AROM      AROM   Overall AROM  Within functional limits for tasks performed    AROM Assessment Site Cervical    Cervical - Right Side Bend 12    Cervical - Left Side Bend 12    Cervical - Right Rotation 55    Cervical - Left Rotation 50                         [facility] Adult PT Treatment/Exercise - 01/10/21 0001      Modalities   Modalities Electrical Stimulation;Moist Heat;Iontophoresis      Moist Heat Therapy   Number Minutes Moist Heat 10 Minutes    Moist Heat Location Lumbar Spine;Shoulder      Electrical Stimulation   Electrical Stimulation Location B low back, R deltoids    Electrical Stimulation Action IFC    Electrical Stimulation Parameters 80-150 hz x10 min    Electrical Stimulation Goals Pain      Iontophoresis   Type of Iontophoresis Dexamethasone    Location right anteior shoulder    Dose 1 ml    Time  4 hour patch      Manual Therapy   Manual Therapy Myofascial release    Myofascial Release IASTW to R deltoids, Tricep and  proximal bicep, R QL, intercostals to reduce TPs and tightness                    PT Short Term Goals - 12/12/20 1339      PT SHORT TERM GOAL #1   Title Low back pain to be evaluated and goals set.    Status Achieved      PT SHORT TERM GOAL #2   Title Ind with initial HEP    Status Achieved             PT Long Term Goals - 01/10/21 0945      PT LONG TERM GOAL #1   Title Patient to report decreased right arm pain by 75% or more with piano playing and OH ADLS    Baseline 93% improvement (duration and intensity have both decreased)    Status Achieved      PT LONG TERM GOAL #2   Title Patient able to sleep for > 5 hours without waking from pain    Status Achieved      PT LONG TERM GOAL #3   Title Patient to demo improved right shoulder ER strength to 4/5 or better.    Status Achieved      PT LONG TERM GOAL #4   Baseline SB L 13 deg; SB R 8 deg; L rot 50 deg R rot 56 deg    Status  Achieved      PT LONG TERM GOAL #5   Title Improved FOTO to 66    Baseline 56    Time 6    Period Weeks    Status Achieved      PT LONG TERM GOAL #6   Title Patient to report decreased LBP by 50% or more with ADLS.    Status Not Met                 Plan - 01/10/21 1430    Clinical Impression Statement Patient presented in clinic with reports of overall improvement since beginning PT. Patient cotinues to have some tenderness of R deltoids, tricep and bicep but less than what he has previously experienced. IASTW completed to RUE with moderate redness response. Greatest tenderness being at inferior deltoid/tricep region. Some R thoracolumbar discomfort and tenderness today but patient reports being very active with yardwork over the last few days. Patient able to attain all goals except LBP which still flares according to activity. Normal modalities response noted following removal of the modalities.    Personal Factors and Comorbidities Comorbidity 2    Comorbidities T2 compression fx, scoliosis    Stability/Clinical Decision Making Evolving/Moderate complexity    Rehab Potential Good    PT Frequency 2x / week    PT Duration 6 weeks    PT Treatment/Interventions ADLs/Self Care Home Management;Cryotherapy;Electrical Stimulation;Iontophoresis 44m/ml Dexamethasone;Moist Heat;Traction;Therapeutic activities;Therapeutic exercise;Neuromuscular re-education;Patient/family education;Manual techniques;Dry needling    PT Next Visit Plan review body mechanics and lifting again, assess ionto    PT Home Exercise Plan [id number]    Consulted and Agree with Plan of Care Patient           Patient will benefit from skilled therapeutic intervention in order to improve the following deficits and impairments:  Decreased range of motion,Pain,Impaired UE functional use,Increased muscle spasms,Impaired flexibility,Postural dysfunction,Decreased strength  Visit Diagnosis: Radiculopathy, cervical  region  Acute pain of right shoulder  Chronic bilateral low back pain without sciatica     Problem List Patient Active Problem List   Diagnosis Date Noted  . GERD (gastroesophageal reflux disease) 11/22/2018  . Closed wedge compression fracture of second thoracic vertebra ([place]) 07/27/2017  . Allergic rhinitis 11/13/2016  . BPH (benign prostatic hyperplasia) 08/06/2015  . HLD (hyperlipidemia) 08/06/2015  . Healthcare maintenance 08/06/2015  . Hemorrhoids, external, thrombosed 11/04/2013    Standley Brooking, PTA 01/10/21 3:15 PM   [facility] [place], [place], [address] Phone: [phone]   Fax:  [phone]  Name: Thales Knipple MRN: [id number] Date of Birth: 09-05-1945  PHYSICAL THERAPY DISCHARGE SUMMARY  Visits from Start of Care: 12.  Current functional level related to goals / functional outcomes: See above.   Remaining deficits: All but LTG #6 met.   Education / Equipment: HEP. Plan: Patient agrees to discharge.  Patient goals were partially met. Patient is being discharged due to being pleased with the current functional level.  ?????          Mali Applegate MPT

## 2021-01-15 ENCOUNTER — Encounter: Payer: Medicare Other | Admitting: Physical Therapy

## 2021-03-12 DIAGNOSIS — H18521 Epithelial (juvenile) corneal dystrophy, right eye: Secondary | ICD-10-CM | POA: Diagnosis not present

## 2021-03-12 DIAGNOSIS — H5213 Myopia, bilateral: Secondary | ICD-10-CM | POA: Diagnosis not present

## 2021-03-12 DIAGNOSIS — H524 Presbyopia: Secondary | ICD-10-CM | POA: Diagnosis not present

## 2021-03-12 DIAGNOSIS — H52203 Unspecified astigmatism, bilateral: Secondary | ICD-10-CM | POA: Diagnosis not present

## 2021-03-12 DIAGNOSIS — H538 Other visual disturbances: Secondary | ICD-10-CM | POA: Diagnosis not present

## 2021-03-12 DIAGNOSIS — H25093 Other age-related incipient cataract, bilateral: Secondary | ICD-10-CM | POA: Diagnosis not present

## 2021-04-05 ENCOUNTER — Other Ambulatory Visit: Payer: Self-pay | Admitting: Family Medicine

## 2021-04-05 DIAGNOSIS — M25511 Pain in right shoulder: Secondary | ICD-10-CM

## 2021-04-05 DIAGNOSIS — M792 Neuralgia and neuritis, unspecified: Secondary | ICD-10-CM

## 2021-05-24 ENCOUNTER — Encounter: Payer: Self-pay | Admitting: Family Medicine

## 2021-05-24 ENCOUNTER — Ambulatory Visit (INDEPENDENT_AMBULATORY_CARE_PROVIDER_SITE_OTHER): Payer: Medicare Other | Admitting: Family Medicine

## 2021-05-24 ENCOUNTER — Other Ambulatory Visit: Payer: Self-pay

## 2021-05-24 VITALS — BP 120/67 | HR 85 | Temp 97.5°F | Ht 66.0 in | Wt 162.8 lb

## 2021-05-24 DIAGNOSIS — M792 Neuralgia and neuritis, unspecified: Secondary | ICD-10-CM | POA: Diagnosis not present

## 2021-05-24 DIAGNOSIS — Z23 Encounter for immunization: Secondary | ICD-10-CM

## 2021-05-24 DIAGNOSIS — E559 Vitamin D deficiency, unspecified: Secondary | ICD-10-CM

## 2021-05-24 DIAGNOSIS — N400 Enlarged prostate without lower urinary tract symptoms: Secondary | ICD-10-CM | POA: Diagnosis not present

## 2021-05-24 DIAGNOSIS — J329 Chronic sinusitis, unspecified: Secondary | ICD-10-CM | POA: Diagnosis not present

## 2021-05-24 DIAGNOSIS — M533 Sacrococcygeal disorders, not elsewhere classified: Secondary | ICD-10-CM

## 2021-05-24 DIAGNOSIS — K219 Gastro-esophageal reflux disease without esophagitis: Secondary | ICD-10-CM | POA: Diagnosis not present

## 2021-05-24 DIAGNOSIS — E782 Mixed hyperlipidemia: Secondary | ICD-10-CM

## 2021-05-24 DIAGNOSIS — J310 Chronic rhinitis: Secondary | ICD-10-CM

## 2021-05-24 DIAGNOSIS — Z91038 Other insect allergy status: Secondary | ICD-10-CM | POA: Diagnosis not present

## 2021-05-24 MED ORDER — TAMSULOSIN HCL 0.4 MG PO CAPS: 0.4000 mg | ORAL_CAPSULE | Freq: Every day | ORAL | 3 refills | Status: DC

## 2021-05-24 MED ORDER — ATORVASTATIN CALCIUM 10 MG PO TABS: 10.0000 mg | ORAL_TABLET | Freq: Every day | ORAL | 3 refills | Status: DC

## 2021-05-24 MED ORDER — AZELASTINE HCL 0.1 % NA SOLN: 1.0000 | Freq: Two times a day (BID) | NASAL | 99 refills | Status: DC

## 2021-05-24 MED ORDER — FINASTERIDE 5 MG PO TABS: 5.0000 mg | ORAL_TABLET | Freq: Every day | ORAL | 3 refills | Status: DC

## 2021-05-24 MED ORDER — CELECOXIB 100 MG PO CAPS: 100.0000 mg | ORAL_CAPSULE | Freq: Two times a day (BID) | ORAL | 1 refills | Status: DC | PRN

## 2021-05-24 MED ORDER — TRIAMCINOLONE ACETONIDE 0.1 % EX CREA: 1.0000 "application " | TOPICAL_CREAM | Freq: Two times a day (BID) | CUTANEOUS | 0 refills | Status: DC

## 2021-05-24 MED ORDER — OMEPRAZOLE 20 MG PO CPDR: 20.0000 mg | DELAYED_RELEASE_CAPSULE | Freq: Every day | ORAL | 3 refills | Status: DC

## 2021-05-24 NOTE — Progress Notes (Signed)
Subjective: CC: Hyperlipidemia PCP: Janora Norlander, DO FKC:LEXNT Raymond Morales is a 75 y.o. male presenting to clinic today for:  1.  Hyperlipidemia Compliant with Lipitor 10 mg daily.  No chest pain, shortness of breath or abdominal pain.  2.  Shoulder pain/low back pain Patient reports that shoulder and arm pain has gotten about 85% better with physical therapy.  He is very pleased with the result as he does not want to pursue any type of surgical intervention.  Has Celebrex on hand for as needed use but needs a refill.  He has been having some SI joint pain and he notes that this was exacerbated recently by reaching over something in his car.  It comes and goes but describes the pain as sharp and short-lived  3.  BPH No complaints of nocturia, urinary frequency or hesitancy.  Compliant with Proscar and Flomax.  Needs refills  4.  Rash Patient does not have any active rashes but likes to have the tramadol on hand just in case  5.  Vitamin D deficiency He reports that he is receiving 5000 units of vitamin D daily.  No reports of bone pain   ROS: Per HPI  Allergies  Allergen Reactions   Latex     Other reaction(s): Unknown   Past Medical History:  Diagnosis Date   Allergy    seasonal   BPH (benign prostatic hypertrophy)    Hyperlipidemia     Current Outpatient Medications:    loratadine (CLARITIN) 10 MG tablet, Take 10 mg by mouth daily., Disp: , Rfl:    atorvastatin (LIPITOR) 10 MG tablet, Take 1 tablet (10 mg total) by mouth daily., Disp: 90 tablet, Rfl: 3   azelastine (ASTELIN) 0.1 % nasal spray, Place 1 spray into both nostrils 2 (two) times daily., Disp: 30 mL, Rfl: prn   celecoxib (CELEBREX) 100 MG capsule, Take 1 capsule (100 mg total) by mouth 2 (two) times daily as needed., Disp: 60 capsule, Rfl: 1   finasteride (PROSCAR) 5 MG tablet, Take 1 tablet (5 mg total) by mouth daily., Disp: 90 tablet, Rfl: 3   omeprazole (PRILOSEC) 20 MG capsule, Take 1 capsule (20 mg  total) by mouth daily., Disp: 90 capsule, Rfl: 3   tamsulosin (FLOMAX) 0.4 MG CAPS capsule, Take 1 capsule (0.4 mg total) by mouth daily., Disp: 90 capsule, Rfl: 3   triamcinolone cream (KENALOG) 0.1 %, Apply 1 application topically 2 (two) times daily. x10 days (if needed for allergic rash), Disp: 45 g, Rfl: 0 Social History   Socioeconomic History   Marital status: Married    Spouse name: Not on file   Number of children: Not on file   Years of education: Not on file   Highest education level: Not on file  Occupational History   Not on file  Tobacco Use   Smoking status: Never   Smokeless tobacco: Never  Vaping Use   Vaping Use: Never used  Substance and Sexual Activity   Alcohol use: No   Drug use: No   Sexual activity: Never  Other Topics Concern   Not on file  Social History Narrative   Not on file   Social Determinants of Health   Financial Resource Strain: Not on file  Food Insecurity: Not on file  Transportation Needs: Not on file  Physical Activity: Not on file  Stress: Not on file  Social Connections: Not on file  Intimate Partner Violence: Not on file   Family History  Problem Relation  Age of Onset   Hypertension Mother    Vision loss Mother    Atrial fibrillation Mother    Heart disease Father    Thyroid disease Father    Hypertension Sister    Atrial fibrillation Sister     Objective: Office vital signs reviewed. BP 120/67   Pulse 85   Temp (!) 97.5 F (36.4 C) (Temporal)   Ht _0  (1.676 m)   Wt 162 lb 12.8 oz (73.8 kg)   SpO2 98%   BMI 26.28 kg/m   Physical Examination:  General: Awake, alert, well nourished, No acute distress HEENT: Normal; sclera white.  No carotid bruits Cardio: regular rate and rhythm, S1S2 heard, no murmurs appreciated Pulm: clear to auscultation bilaterally, no wheezes, rhonchi or rales; normal work of breathing on room air Extremities: warm, well perfused, No edema, cyanosis or clubbing; +2 pulses  bilaterally MSK: Ambulating independently.  Looks to have fairly good range of motion of her right shoulder  Lumbar spine: Mild tenderness palpation over the base on the right of the sacrum.  No palpable abnormalities  Assessment/ Plan: 75 y.o. male   Mixed hyperlipidemia - Plan: atorvastatin (LIPITOR) 10 MG tablet, CMP14+EGFR, Lipid Panel, TSH  Benign prostatic hyperplasia without lower urinary tract symptoms - Plan: finasteride (PROSCAR) 5 MG tablet, tamsulosin (FLOMAX) 0.4 MG CAPS capsule, PSA  Gastroesophageal reflux disease without esophagitis - Plan: omeprazole (PRILOSEC) 20 MG capsule  Radicular pain in right arm - Plan: celecoxib (CELEBREX) 100 MG capsule  Allergic reaction to insect bite - Plan: triamcinolone cream (KENALOG) 0.1 %  Rhinosinusitis - Plan: azelastine (ASTELIN) 0.1 % nasal spray  Vitamin D deficiency - Plan: VITAMIN D 25 Hydroxy (Vit-D Deficiency, Fractures)  SI (sacroiliac) pain  Fasting.  Check fasting labs.  Lipitor renewed.  Asymptomatic from a BPH standpoint.  Check PSA.  Continue Proscar and Flomax  GERD stable.  PPI renewed  Right shoulder pain has improved tremendously with PT.  He is about 85% better with this and happy with results.  Celebrex renewed for as needed use  No active insect bite/allergic reaction.  Like to have triamcinolone on hand for as needed use  Rhinosinusitis was not discussed today.  He simply needed a refill  Compliant with OTC vitamin D.  Check vitamin D level  Sounds like SI joint pain versus lumbosacral pain.  He does not wish to pursue physical therapy yet but to make mention of this issue today  No orders of the defined types were placed in this encounter.  Meds ordered this encounter  Medications   celecoxib (CELEBREX) 100 MG capsule    Sig: Take 1 capsule (100 mg total) by mouth 2 (two) times daily as needed.    Dispense:  60 capsule    Refill:  1    This prescription was filled on 03/11/2021. Any refills  authorized will be placed on file.   triamcinolone cream (KENALOG) 0.1 %    Sig: Apply 1 application topically 2 (two) times daily. x10 days (if needed for allergic rash)    Dispense:  45 g    Refill:  0   finasteride (PROSCAR) 5 MG tablet    Sig: Take 1 tablet (5 mg total) by mouth daily.    Dispense:  90 tablet    Refill:  3   tamsulosin (FLOMAX) 0.4 MG CAPS capsule    Sig: Take 1 capsule (0.4 mg total) by mouth daily.    Dispense:  90 capsule    Refill:  3   omeprazole (PRILOSEC) 20 MG capsule    Sig: Take 1 capsule (20 mg total) by mouth daily.    Dispense:  90 capsule    Refill:  3   azelastine (ASTELIN) 0.1 % nasal spray    Sig: Place 1 spray into both nostrils 2 (two) times daily.    Dispense:  30 mL    Refill:  prn   atorvastatin (LIPITOR) 10 MG tablet    Sig: Take 1 tablet (10 mg total) by mouth daily.    Dispense:  90 tablet    Refill:  [place], [place] [phone]

## 2021-05-25 LAB — LIPID PANEL
Chol/HDL Ratio: 3.7 ratio (ref 0.0–5.0)
Cholesterol, Total: 161 mg/dL (ref 100–199)
HDL: 43 mg/dL (ref >39)
LDL Chol Calc (NIH): 92 mg/dL (ref 0–99)
Triglycerides: 148 mg/dL (ref 0–149)
VLDL Cholesterol Cal: 26 mg/dL (ref 5–40)

## 2021-05-25 LAB — CMP14+EGFR
ALT: 19 IU/L (ref 0–44)
AST: 27 IU/L (ref 0–40)
Albumin/Globulin Ratio: 1.3 (ref 1.2–2.2)
Albumin: 4 g/dL (ref 3.7–4.7)
Alkaline Phosphatase: 116 IU/L (ref 44–121)
BUN/Creatinine Ratio: 16 (ref 10–24)
BUN: 16 mg/dL (ref 8–27)
Bilirubin Total: 0.7 mg/dL (ref 0.0–1.2)
CO2: 21 mmol/L (ref 20–29)
Calcium: 9.5 mg/dL (ref 8.6–10.2)
Chloride: 103 mmol/L (ref 96–106)
Creatinine, Ser: 1.03 mg/dL (ref 0.76–1.27)
Globulin, Total: 3 g/dL (ref 1.5–4.5)
Glucose: 99 mg/dL (ref 70–99)
Potassium: 4.2 mmol/L (ref 3.5–5.2)
Sodium: 139 mmol/L (ref 134–144)
Total Protein: 7 g/dL (ref 6.0–8.5)
eGFR: 76 mL/min/{1.73_m2} (ref >59)

## 2021-05-25 LAB — PSA: Prostate Specific Ag, Serum: 0.3 ng/mL (ref 0.0–4.0)

## 2021-05-25 LAB — TSH: TSH: 2.7 u[IU]/mL (ref 0.450–4.500)

## 2021-05-25 LAB — VITAMIN D 25 HYDROXY (VIT D DEFICIENCY, FRACTURES): Vit D, 25-Hydroxy: 57.2 ng/mL (ref 30.0–100.0)

## 2021-06-04 DIAGNOSIS — H25093 Other age-related incipient cataract, bilateral: Secondary | ICD-10-CM | POA: Diagnosis not present

## 2021-06-04 DIAGNOSIS — H538 Other visual disturbances: Secondary | ICD-10-CM | POA: Diagnosis not present

## 2021-06-04 DIAGNOSIS — H524 Presbyopia: Secondary | ICD-10-CM | POA: Diagnosis not present

## 2021-06-04 DIAGNOSIS — H52203 Unspecified astigmatism, bilateral: Secondary | ICD-10-CM | POA: Diagnosis not present

## 2021-06-04 DIAGNOSIS — H5213 Myopia, bilateral: Secondary | ICD-10-CM | POA: Diagnosis not present

## 2021-06-04 DIAGNOSIS — H18521 Epithelial (juvenile) corneal dystrophy, right eye: Secondary | ICD-10-CM | POA: Diagnosis not present

## 2021-09-05 ENCOUNTER — Telehealth: Payer: Self-pay | Admitting: Family Medicine

## 2021-09-09 ENCOUNTER — Telehealth: Payer: Self-pay | Admitting: Family Medicine

## 2021-09-09 NOTE — Telephone Encounter (Signed)
Left message for patient to call back and schedule Medicare Annual Wellness Visit (AWV) to be completed by video or phone.   Last AWV: 11/25/2016  Please schedule at anytime with [place]  45 minute appointment  Any questions, please contact me at [phone]

## 2021-09-11 ENCOUNTER — Ambulatory Visit (INDEPENDENT_AMBULATORY_CARE_PROVIDER_SITE_OTHER): Payer: Medicare Other

## 2021-09-11 VITALS — Ht 66.0 in | Wt 162.0 lb

## 2021-09-11 DIAGNOSIS — Z0000 Encounter for general adult medical examination without abnormal findings: Secondary | ICD-10-CM

## 2021-09-11 NOTE — Patient Instructions (Signed)
Raymond Morales , Thank you for taking time to come for your Medicare Wellness Visit. I appreciate your ongoing commitment to your health goals. Please review the following plan we discussed and let me know if I can assist you in the future.   Screening recommendations/referrals: Colonoscopy: No longer required due to age.  Recommended yearly ophthalmology/optometry visit for glaucoma screening and checkup Recommended yearly dental visit for hygiene and checkup  Vaccinations: Influenza vaccine: Done 05/24/2021. Pneumococcal vaccine: Done 08/04/2014 and 08/06/2015 Tdap vaccine: Due Repeat in 10 years  Shingles vaccine: Zoster 08/02/2013. Shingrix discussed. Please contact your pharmacy for coverage information.     Covid-19: Done 09/18/2019, 10/19/2019 and 07/11/2020.  Advanced directives: Advance directive discussed with you today. Even though you declined this today, please call our office should you change your mind, and we can give you the proper paperwork for you to fill out.   Conditions/risks identified: Aim for 30 minutes of exercise or brisk walking each day, drink 6-8 glasses of water and eat lots of fruits and vegetables. KEEP UP THE GOOD WORK!!  Next appointment: Follow up in one year for your annual wellness visit. 2024.  Preventive Care 76 Years and Older, Male  Preventive care refers to lifestyle choices and visits with your health care provider that can promote health and wellness. What does preventive care include? A yearly physical exam. This is also called an annual well check. Dental exams once or twice a year. Routine eye exams. Ask your health care provider how often you should have your eyes checked. Personal lifestyle choices, including: Daily care of your teeth and gums. Regular physical activity. Eating a healthy diet. Avoiding tobacco and drug use. Limiting alcohol use. Practicing safe sex. Taking low doses of aspirin every day. Taking vitamin and mineral  supplements as recommended by your health care provider. What happens during an annual well check? The services and screenings done by your health care provider during your annual well check will depend on your age, overall health, lifestyle risk factors, and family history of disease. Counseling  Your health care provider may ask you questions about your: Alcohol use. Tobacco use. Drug use. Emotional well-being. Home and relationship well-being. Sexual activity. Eating habits. History of falls. Memory and ability to understand (cognition). Work and work Statistician. Screening  You may have the following tests or measurements: Height, weight, and BMI. Blood pressure. Lipid and cholesterol levels. These may be checked every 5 years, or more frequently if you are over 47 years old. Skin check. Lung cancer screening. You may have this screening every year starting at age 7 if you have a 30-pack-year history of smoking and currently smoke or have quit within the past 15 years. Fecal occult blood test (FOBT) of the stool. You may have this test every year starting at age 9. Flexible sigmoidoscopy or colonoscopy. You may have a sigmoidoscopy every 5 years or a colonoscopy every 10 years starting at age 41. Prostate cancer screening. Recommendations will vary depending on your family history and other risks. Hepatitis C blood test. Hepatitis B blood test. Sexually transmitted disease (STD) testing. Diabetes screening. This is done by checking your blood sugar (glucose) after you have not eaten for a while (fasting). You may have this done every 1-3 years. Abdominal aortic aneurysm (AAA) screening. You may need this if you are a current or former smoker. Osteoporosis. You may be screened starting at age 60 if you are at high risk. Talk with your health care provider about  your test results, treatment options, and if necessary, the need for more tests. Vaccines  Your health care provider  may recommend certain vaccines, such as: Influenza vaccine. This is recommended every year. Tetanus, diphtheria, and acellular pertussis (Tdap, Td) vaccine. You may need a Td booster every 10 years. Zoster vaccine. You may need this after age 46. Pneumococcal 13-valent conjugate (PCV13) vaccine. One dose is recommended after age 98. Pneumococcal polysaccharide (PPSV23) vaccine. One dose is recommended after age 5. Talk to your health care provider about which screenings and vaccines you need and how often you need them. This information is not intended to replace advice given to you by your health care provider. Make sure you discuss any questions you have with your health care provider. Document Released: 09/07/2015 Document Revised: 04/30/2016 Document Reviewed: 06/12/2015 Elsevier Interactive Patient Education  2017 [employer] Prevention in the Home Falls can cause injuries. They can happen to people of all ages. There are many things you can do to make your home safe and to help prevent falls. What can I do on the outside of my home? Regularly fix the edges of walkways and driveways and fix any cracks. Remove anything that might make you trip as you walk through a door, such as a raised step or threshold. Trim any bushes or trees on the path to your home. Use bright outdoor lighting. Clear any walking paths of anything that might make someone trip, such as rocks or tools. Regularly check to see if handrails are loose or broken. Make sure that both sides of any steps have handrails. Any raised decks and porches should have guardrails on the edges. Have any leaves, snow, or ice cleared regularly. Use sand or salt on walking paths during winter. Clean up any spills in your garage right away. This includes oil or grease spills. What can I do in the bathroom? Use night lights. Install grab bars by the toilet and in the tub and shower. Do not use towel bars as grab bars. Use  non-skid mats or decals in the tub or shower. If you need to sit down in the shower, use a plastic, non-slip stool. Keep the floor dry. Clean up any water that spills on the floor as soon as it happens. Remove soap buildup in the tub or shower regularly. Attach bath mats securely with double-sided non-slip rug tape. Do not have throw rugs and other things on the floor that can make you trip. What can I do in the bedroom? Use night lights. Make sure that you have a light by your bed that is easy to reach. Do not use any sheets or blankets that are too big for your bed. They should not hang down onto the floor. Have a firm chair that has side arms. You can use this for support while you get dressed. Do not have throw rugs and other things on the floor that can make you trip. What can I do in the kitchen? Clean up any spills right away. Avoid walking on wet floors. Keep items that you use a lot in easy-to-reach places. If you need to reach something above you, use a strong step stool that has a grab bar. Keep electrical cords out of the way. Do not use floor polish or wax that makes floors slippery. If you must use wax, use non-skid floor wax. Do not have throw rugs and other things on the floor that can make you trip. What can I do with  my stairs? Do not leave any items on the stairs. Make sure that there are handrails on both sides of the stairs and use them. Fix handrails that are broken or loose. Make sure that handrails are as long as the stairways. Check any carpeting to make sure that it is firmly attached to the stairs. Fix any carpet that is loose or worn. Avoid having throw rugs at the top or bottom of the stairs. If you do have throw rugs, attach them to the floor with carpet tape. Make sure that you have a light switch at the top of the stairs and the bottom of the stairs. If you do not have them, ask someone to add them for you. What else can I do to help prevent falls? Wear  shoes that: Do not have high heels. Have rubber bottoms. Are comfortable and fit you well. Are closed at the toe. Do not wear sandals. If you use a stepladder: Make sure that it is fully opened. Do not climb a closed stepladder. Make sure that both sides of the stepladder are locked into place. Ask someone to hold it for you, if possible. Clearly mark and make sure that you can see: Any grab bars or handrails. First and last steps. Where the edge of each step is. Use tools that help you move around (mobility aids) if they are needed. These include: Canes. Walkers. Scooters. Crutches. Turn on the lights when you go into a dark area. Replace any light bulbs as soon as they burn out. Set up your furniture so you have a clear path. Avoid moving your furniture around. If any of your floors are uneven, fix them. If there are any pets around you, be aware of where they are. Review your medicines with your doctor. Some medicines can make you feel dizzy. This can increase your chance of falling. Ask your doctor what other things that you can do to help prevent falls. This information is not intended to replace advice given to you by your health care provider. Make sure you discuss any questions you have with your health care provider. Document Released: 06/07/2009 Document Revised: 01/17/2016 Document Reviewed: 09/15/2014 Elsevier Interactive Patient Education  2017 [employer].

## 2021-09-11 NOTE — Progress Notes (Signed)
Subjective:   Raymond Morales is a 76 y.o. male who presents for Medicare Annual/Subsequent preventive examination. Virtual Visit via Telephone Note  I connected with  Raymond Morales on 09/11/21 at  9:00 AM EST by telephone and verified that I am speaking with the correct person using two identifiers.  Location: Patient: Home Provider: [facility] Persons participating in the virtual visit: patient/Nurse Health Advisor   I discussed the limitations, risks, security and privacy concerns of performing an evaluation and management service by telephone and the availability of in person appointments. The patient expressed understanding and agreed to proceed.  Interactive audio and video telecommunications were attempted between this nurse and patient, however failed, due to patient having technical difficulties OR patient did not have access to video capability.  We continued and completed visit with audio only.  Some vital signs may be absent or patient reported.   Chriss Driver, LPN  Review of Systems     Cardiac Risk Factors include: advanced age (>58men, >59 women);dyslipidemia;male gender;sedentary lifestyle;Other (see comment), Risk factor comments: Hx of fractrued vertebre.  PHONE VISIT. PT AT HOME. NURSE AT [facility].    Objective:    Today's Vitals   09/11/21 0901  Weight: 162 lb (73.5 kg)  Height: 5\' 6"  (1.676 m)   Body mass index is 26.15 kg/m.  Advanced Directives 09/11/2021 12/04/2020 11/25/2016  Does Patient Have a Medical Advance Directive? No No No  Would patient like information on creating a medical advance directive? No - Patient declined No - Patient declined Yes (MAU/Ambulatory/Procedural Areas - Information given)    Current Medications (verified) Outpatient Encounter Medications as of 09/11/2021  Medication Sig   atorvastatin (LIPITOR) 10 MG tablet Take 1 tablet (10 mg total) by mouth daily.   azelastine (ASTELIN) 0.1 % nasal spray Place 1 spray into both nostrils 2  (two) times daily.   celecoxib (CELEBREX) 100 MG capsule Take 1 capsule (100 mg total) by mouth 2 (two) times daily as needed.   finasteride (PROSCAR) 5 MG tablet Take 1 tablet (5 mg total) by mouth daily.   loratadine (CLARITIN) 10 MG tablet Take 10 mg by mouth daily.   omeprazole (PRILOSEC) 20 MG capsule Take 1 capsule (20 mg total) by mouth daily.   tamsulosin (FLOMAX) 0.4 MG CAPS capsule Take 1 capsule (0.4 mg total) by mouth daily.   triamcinolone cream (KENALOG) 0.1 % Apply 1 application topically 2 (two) times daily. x10 days (if needed for allergic rash)   No facility-administered encounter medications on file as of 09/11/2021.    Allergies (verified) Latex   History: Past Medical History:  Diagnosis Date   Allergy    seasonal   BPH (benign prostatic hypertrophy)    Hyperlipidemia    History reviewed. No pertinent surgical history. Family History  Problem Relation Age of Onset   Hypertension Mother    Vision loss Mother    Atrial fibrillation Mother    Heart disease Father    Thyroid disease Father    Hypertension Sister    Atrial fibrillation Sister    Social History   Socioeconomic History   Marital status: Married    Spouse name: Raymond Morales   Number of children: 2   Years of education: Not on file   Highest education level: Not on file  Occupational History   Not on file  Tobacco Use   Smoking status: Never   Smokeless tobacco: Never  Vaping Use   Vaping Use: Never used  Substance and Sexual  Activity   Alcohol use: No   Drug use: No   Sexual activity: Never  Other Topics Concern   Not on file  Social History Narrative   1 daughter and 1 son   Married since 1974. 02/06/73   Social Determinants of Health   Financial Resource Strain: Low Risk    Difficulty of Paying Living Expenses: Not hard at all  Food Insecurity: No Food Insecurity   Worried About Charity fundraiser in the Last Year: Never true   Ran Out of Food in the Last Year: Never true   Transportation Needs: No Transportation Needs   Lack of Transportation (Medical): No   Lack of Transportation (Non-Medical): No  Physical Activity: Insufficiently Active   Days of Exercise per Week: 5 days   Minutes of Exercise per Session: 20 min  Stress: No Stress Concern Present   Feeling of Stress : Only a little  Social Connections: Engineer, building services of Communication with Friends and Family: More than three times a week   Frequency of Social Gatherings with Friends and Family: More than three times a week   Attends Religious Services: More than 4 times per year   Active Member of [employer] or Organizations: Yes   Attends Music therapist: More than 4 times per year   Marital Status: Married    Tobacco Counseling Counseling given: Not Answered   Clinical Intake:  Pre-visit preparation completed: Yes  Pain : No/denies pain     BMI - recorded: 26.15 Nutritional Status: BMI 25 -29 Overweight Nutritional Risks: None Diabetes: No  How often do you need to have someone help you when you read instructions, pamphlets, or other written materials from your doctor or pharmacy?: 1 - Never  Diabetic?no  Interpreter Needed?: No  Information entered by :: MJ Rudean Icenhour,LPN   Activities of Daily Living In your present state of health, do you have any difficulty performing the following activities: 09/11/2021  Hearing? N  Vision? N  Difficulty concentrating or making decisions? Y  Comment At times.  Walking or climbing stairs? N  Dressing or bathing? N  Doing errands, shopping? N  Preparing Food and eating ? N  Using the Toilet? N  In the past six months, have you accidently leaked urine? N  Do you have problems with loss of bowel control? N  Managing your Medications? N  Managing your Finances? N  Housekeeping or managing your Housekeeping? N  Some recent data might be hidden    Patient Care Team: Janora Norlander, DO as PCP - General (Family  Medicine)  Indicate any recent Medical Services you may have received from other than [facility] providers in the past year (date may be approximate).     Assessment:   This is a routine wellness examination for Dorr.  Hearing/Vision screen Hearing Screening - Comments:: Some hearing issues with high pitched sounds. Vision Screening - Comments:: Glasses. Dr. Lanny Cramp in [place]. 05/2021.  Dietary issues and exercise activities discussed: Current Exercise Habits: Home exercise routine, Type of exercise: walking, Time (Minutes): 20, Frequency (Times/Week): 5, Weekly Exercise (Minutes/Week): 100, Intensity: Mild, Exercise limited by: cardiac condition(s);orthopedic condition(s)   Goals Addressed             This Visit's Progress    Exercise 3x per week (30 min per time)   On track      Depression Screen [facility] 2/9 Scores 09/11/2021 05/24/2021 11/20/2020 05/23/2020 11/23/2019 05/25/2019 11/16/2017  PHQ - 2 Score  0 0 0 0 0 0 0  PHQ- 9 Score - 0 - - 0 0 -    Fall Risk Fall Risk  09/11/2021 05/24/2021 11/20/2020 05/23/2020 11/23/2019  Falls in the past year? 0 0 0 0 0  Number falls in past yr: 0 - - - -  Injury with Fall? 0 - - - -  Risk for fall due to : No Fall Risks - - - -  Follow up Falls prevention discussed - - - -    FALL RISK PREVENTION PERTAINING TO THE HOME:  Any stairs in or around the home? Yes  If so, are there any without handrails? No  Home free of loose throw rugs in walkways, pet beds, electrical cords, etc? Yes  Adequate lighting in your home to reduce risk of falls? Yes   ASSISTIVE DEVICES UTILIZED TO PREVENT FALLS:  Life alert? No  Use of a cane, walker or w/c? No  Grab bars in the bathroom? No  Shower chair or bench in shower? No  Elevated toilet seat or a handicapped toilet? Yes   TIMED UP AND GO:  Was the test performed? No .  Phone visit.  Cognitive Function: MMSE - Mini Mental State Exam 11/25/2016  Orientation to time 5  Orientation to Place 5  Registration  3  Attention/ Calculation 5  Recall 2  Language- name 2 objects 2  Language- repeat 1  Language- follow 3 step command 3  Language- read & follow direction 1  Write a sentence 1  Copy design 1  Total score 29     6CIT Screen 09/11/2021  What Year? 0 points  What month? 0 points  What time? 0 points  Count back from 20 0 points  Months in reverse 0 points  Repeat phrase 0 points  Total Score 0    Immunizations Immunization History  Administered Date(s) Administered   Fluad Quad(high Dose 65+) 05/25/2019, 05/23/2020, 05/24/2021   Influenza, High Dose Seasonal PF 05/27/2017, 05/21/2018, 06/01/2018   Influenza,inj,Quad PF,6+ Mos 05/12/2016   Influenza-Unspecified 06/13/2013, 06/26/2014, 05/28/2015   Moderna SARS-COV2 Booster Vaccination 07/11/2020   Moderna Sars-Covid-2 Vaccination 09/18/2019, 10/19/2019   Pneumococcal Conjugate-13 08/04/2014   Pneumococcal Polysaccharide-23 08/06/2015   Zoster, Live 08/02/2013    TDAP status: Due, Education has been provided regarding the importance of this vaccine. Advised may receive this vaccine at local pharmacy or Health Dept. Aware to provide a copy of the vaccination record if obtained from local pharmacy or Health Dept. Verbalized acceptance and understanding.  Flu Vaccine status: Up to date  Pneumococcal vaccine status: Up to date  Covid-19 vaccine status: Completed vaccines  Qualifies for Shingles Vaccine? Yes   Zostavax completed Yes   Shingrix Completed?: No.    Education has been provided regarding the importance of this vaccine. Patient has been advised to call insurance company to determine out of pocket expense if they have not yet received this vaccine. Advised may also receive vaccine at local pharmacy or Health Dept. Verbalized acceptance and understanding.  Screening Tests Health Maintenance  Topic Date Due   Zoster Vaccines- Shingrix (1 of 2) Never done   COVID-19 Vaccine (3 - Booster for Moderna series) 09/05/2020    TETANUS/TDAP  04/25/2021   COLONOSCOPY (Pts 45-19yrs Insurance coverage will need to be confirmed)  11/20/2021 (Originally 05/27/1991)   Pneumonia Vaccine 60+ Years old  Completed   INFLUENZA VACCINE  Completed   Hepatitis C Screening  Completed   HPV VACCINES  Aged Out  Health Maintenance  Health Maintenance Due  Topic Date Due   Zoster Vaccines- Shingrix (1 of 2) Never done   COVID-19 Vaccine (3 - Booster for Moderna series) 09/05/2020   TETANUS/TDAP  04/25/2021    Colorectal cancer screening: No longer required.   Lung Cancer Screening: (Low Dose CT Chest recommended if Age 77-80 years, 30 pack-year currently smoking OR have quit w/in 15years.) does not qualify.    Additional Screening:  Hepatitis C Screening: does qualify; Completed 08/06/2015  Vision Screening: Recommended annual ophthalmology exams for early detection of glaucoma and other disorders of the eye. Is the patient up to date with their annual eye exam?  Yes  Who is the provider or what is the name of the office in which the patient attends annual eye exams? Dr. Lanny Cramp in [place] If pt is not established with a provider, would they like to be referred to a provider to establish care? No .   Dental Screening: Recommended annual dental exams for proper oral hygiene  Community Resource Referral / Chronic Care Management: CRR required this visit?  No   CCM required this visit?  No      Plan:     I have personally reviewed and noted the following in the patients chart:   Medical and social history Use of alcohol, tobacco or illicit drugs  Current medications and supplements including opioid prescriptions. Patient is not currently taking opioid prescriptions. Functional ability and status Nutritional status Physical activity Advanced directives List of other physicians Hospitalizations, surgeries, and ER visits in previous 12 months Vitals Screenings to include cognitive, depression, and  falls Referrals and appointments  In addition, I have reviewed and discussed with patient certain preventive protocols, quality metrics, and best practice recommendations. A written personalized care plan for preventive services as well as general preventive health recommendations were provided to patient.     Chriss Driver, LPN   09/18/5807   Nurse Notes: Pt is up to date on all age appropriate health maintenance. Discussed Shingrix and how to obtain. 6CIT score of 0.

## 2021-11-20 ENCOUNTER — Ambulatory Visit (INDEPENDENT_AMBULATORY_CARE_PROVIDER_SITE_OTHER): Payer: Medicare Other | Admitting: Family Medicine

## 2021-11-20 ENCOUNTER — Encounter: Payer: Self-pay | Admitting: Family Medicine

## 2021-11-20 VITALS — BP 139/79 | HR 75 | Temp 98.0°F | Ht 66.0 in | Wt 170.2 lb

## 2021-11-20 DIAGNOSIS — M792 Neuralgia and neuritis, unspecified: Secondary | ICD-10-CM | POA: Diagnosis not present

## 2021-11-20 DIAGNOSIS — E559 Vitamin D deficiency, unspecified: Secondary | ICD-10-CM

## 2021-11-20 DIAGNOSIS — L570 Actinic keratosis: Secondary | ICD-10-CM

## 2021-11-20 DIAGNOSIS — Z23 Encounter for immunization: Secondary | ICD-10-CM

## 2021-11-20 DIAGNOSIS — M545 Low back pain, unspecified: Secondary | ICD-10-CM

## 2021-11-20 DIAGNOSIS — G8929 Other chronic pain: Secondary | ICD-10-CM

## 2021-11-20 DIAGNOSIS — R718 Other abnormality of red blood cells: Secondary | ICD-10-CM | POA: Diagnosis not present

## 2021-11-20 MED ORDER — CELECOXIB 100 MG PO CAPS: 100.0000 mg | ORAL_CAPSULE | Freq: Two times a day (BID) | ORAL | 1 refills | Status: DC | PRN

## 2021-11-20 NOTE — Patient Instructions (Signed)
You had labs performed today.  You will be contacted with the results of the labs once they are available, usually in the next 3 business days for routine lab work.  If you have an active my chart account, they will be released to your MyChart.  If you prefer to have these labs released to you via telephone, please let us know. ? ? Cryoablation, Care After ?This sheet gives you information about how to care for yourself after your procedure. Your health care provider may also give you more specific instructions. If you have problems or questions, contact your health care provider. ?What can I expect after the procedure? ?After the procedure, it is common to have: ?Soreness around the treatment area. ?Mild pain and swelling in the treatment area. ?Follow these instructions at home: ?Treatment area care ? ?If you have an incision, follow instructions from your health care provider about how to take care of it. Make sure you: ?Wash your hands with soap and water for at least 20 seconds before and after you change your bandage (dressing). If soap and water are not available, use hand sanitizer. ?Change your dressing as told by your health care provider. ?Leave stitches (sutures), skin glue, or adhesive strips in place. These skin closures may need to stay in place for 2 weeks or longer. If adhesive strip edges start to loosen and curl up, you may trim the loose edges. Do not remove adhesive strips completely unless your health care provider tells you to do that. ?Check your treatment area every day for signs of infection. Check for: ?More redness, swelling, or pain. ?Fluid or blood. ?Warmth. ?Pus or a bad smell. ?Keep the treated area clean, dry, and covered with a dressing until it has healed. Clean the area with soap and water or as told by your health care provider. If your bandage gets wet, change it right away. ?Do not take baths, swim, or use a hot tub until your health care provider approves. Ask your health care  provider if you may take showers. You may only be allowed to take sponge baths. ?Activity ? ?Follow instructions from your health care provider about any activity limitations, including lifting heavy objects. ?If you were given a sedative during the procedure, it can affect you for several hours. Do not drive or operate machinery until your health care provider says that it is safe. ?General instructions ?Take over-the-counter and prescription medicines only as told by your health care provider. ?Do not use any products that contain nicotine or tobacco, such as cigarettes, e-cigarettes, and chewing tobacco. These can delay incision healing. If you need help quitting, ask your health care provider. ?Keep all follow-up visits as told by your health care provider. This is important. ?Contact a health care provider if: ?You have increased pain. ?You have a fever. ?You have nausea or vomiting. ?You have any of these signs of infection: ?More redness, swelling, or pain around your treatment area. ?Fluid or blood coming from your treatment area. ?Warmth coming from your treatment area. ?Pus or a bad smell coming from your treatment area. ?You do not have a bowel movement for 2 days. ?Get help right away if you have: ?Severe pain. ?Trouble swallowing or breathing. ?Severe weakness or dizziness. ?Chest pain or shortness of breath. ?These symptoms may represent a serious problem that is an emergency. Do not wait to see if the symptoms will go away. Get medical help right away. Call your local emergency services (911 in the [place]  Do not drive yourself to the hospital. ?Summary ?After the procedure, it is common for the treatment area to be sore, mildly painful, and swollen. ?If you have a dressing, change it as told by your health care provider. ?Follow instructions from your health care provider about any activity limitations. ?Contact a health care provider if you have increased pain or a fever. ?This information is not  intended to replace advice given to you by your health care provider. Make sure you discuss any questions you have with your health care provider. ?Document Revised: 05/19/2019 Document Reviewed: 05/19/2019 ?Elsevier Patient Education ? 2022 [employer]. ? ? ?

## 2021-11-20 NOTE — Progress Notes (Signed)
? ?Subjective: ?CC: Checkup ?PCP: Janora Norlander, DO ?HER:Raymond Morales is a 76 y.o. male presenting to clinic today for: ? ?1.  Low back pain ?Patient reports that he is been having some exacerbations intermittently of right-sided low back pain.  He points to the lumbosacral aspect of the back.  His right upper extremity/shoulder pain has gotten a lot better with physical therapy and he is considering physical therapy for his low back but would like to wait it out first.  He will contact me if he wants to proceed with this ? ?2.  Skin lesions ?Patient reports multiple skin lesions along the forearms and he wanted to make sure that these were nothing to be concerned about.  He denies any spontaneous bleeding, change in size or pigmentation. ? ?3.  Vitamin D deficiency ?Patient reports compliance with OTC vitamin D. ? ? ?ROS: Per HPI ? ?Allergies  ?Allergen Reactions  ? Latex   ?  Other reaction(s): Unknown  ? ?Past Medical History:  ?Diagnosis Date  ? Allergy   ? seasonal  ? BPH (benign prostatic hypertrophy)   ? Hyperlipidemia   ? ? ?Current Outpatient Medications:  ?  atorvastatin (LIPITOR) 10 MG tablet, Take 1 tablet (10 mg total) by mouth daily., Disp: 90 tablet, Rfl: 3 ?  azelastine (ASTELIN) 0.1 % nasal spray, Place 1 spray into both nostrils 2 (two) times daily., Disp: 30 mL, Rfl: prn ?  celecoxib (CELEBREX) 100 MG capsule, Take 1 capsule (100 mg total) by mouth 2 (two) times daily as needed., Disp: 60 capsule, Rfl: 1 ?  finasteride (PROSCAR) 5 MG tablet, Take 1 tablet (5 mg total) by mouth daily., Disp: 90 tablet, Rfl: 3 ?  loratadine (CLARITIN) 10 MG tablet, Take 10 mg by mouth daily., Disp: , Rfl:  ?  omeprazole (PRILOSEC) 20 MG capsule, Take 1 capsule (20 mg total) by mouth daily., Disp: 90 capsule, Rfl: 3 ?  tamsulosin (FLOMAX) 0.4 MG CAPS capsule, Take 1 capsule (0.4 mg total) by mouth daily., Disp: 90 capsule, Rfl: 3 ?  triamcinolone cream (KENALOG) 0.1 %, Apply 1 application topically 2 (two)  times daily. x10 days (if needed for allergic rash), Disp: 45 g, Rfl: 0 ?Social History  ? ?Socioeconomic History  ? Marital status: Married  ?  Spouse name: Izora Gala  ? Number of children: 2  ? Years of education: Not on file  ? Highest education level: Not on file  ?Occupational History  ? Not on file  ?Tobacco Use  ? Smoking status: Never  ? Smokeless tobacco: Never  ?Vaping Use  ? Vaping Use: Never used  ?Substance and Sexual Activity  ? Alcohol use: No  ? Drug use: No  ? Sexual activity: Never  ?Other Topics Concern  ? Not on file  ?Social History Narrative  ? 1 daughter and 1 son  ? Married since 1974. 02/06/73  ? ?Social Determinants of Health  ? ?Financial Resource Strain: Low Risk   ? Difficulty of Paying Living Expenses: Not hard at all  ?Food Insecurity: No Food Insecurity  ? Worried About Charity fundraiser in the Last Year: Never true  ? Ran Out of Food in the Last Year: Never true  ?Transportation Needs: No Transportation Needs  ? Lack of Transportation (Medical): No  ? Lack of Transportation (Non-Medical): No  ?Physical Activity: Insufficiently Active  ? Days of Exercise per Week: 5 days  ? Minutes of Exercise per Session: 20 min  ?Stress: No Stress Concern Present  ?  Feeling of Stress : Only a little  ?Social Connections: Socially Integrated  ? Frequency of Communication with Friends and Family: More than three times a week  ? Frequency of Social Gatherings with Friends and Family: More than three times a week  ? Attends Religious Services: More than 4 times per year  ? Active Member of Clubs or Organizations: Yes  ? Attends Archivist Meetings: More than 4 times per year  ? Marital Status: Married  ?Intimate Partner Violence: Not At Risk  ? Fear of Current or Ex-Partner: No  ? Emotionally Abused: No  ? Physically Abused: No  ? Sexually Abused: No  ? ?Family History  ?Problem Relation Age of Onset  ? Hypertension Mother   ? Vision loss Mother   ? Atrial fibrillation Mother   ? Heart disease  Father   ? Thyroid disease Father   ? Hypertension Sister   ? Atrial fibrillation Sister   ? ? ?Objective: ?Office vital signs reviewed. ?BP 139/79   Pulse 75   Temp 98 ?F (36.7 ?C)   Ht '5\' 6"'$  (1.676 m)   Wt 170 lb 3.2 oz (77.2 kg)   SpO2 94%   BMI 27.47 kg/m?  ? ?Physical Examination:  ?General: Awake, alert, well nourished, No acute distress ?HEENT: No carotid bruits ?Cardio: regular rate and rhythm, S1S2 heard, no murmurs appreciated ?Pulm: clear to auscultation bilaterally, no wheezes, rhonchi or rales; normal work of breathing on room air ?Skin: Actinic keratoses noted on scalp x3, left elbow x1 and right forearm x1 ?MSK: Ambulating independently ? ?Cryotherapy Procedure: ? ?Risks and benefits of procedure were reviewed with the patient.  Written consent obtained and scanned into the chart.   ? ?Lesion #1 of concern was identified and located on apex of scalp.  Liquid nitrogen was applied to area of concern and extending out 1 millimeters beyond the border of the lesion.  Treated area was allowed to come back to room temperature before treating it a second time.   ? ?Lesion #2 of concern was identified and located on anterior left scalp.  Liquid nitrogen was applied to area of concern and extending out 1 millimeters beyond the border of the lesion.  Treated area was allowed to come back to room temperature before treating it a second time.   ? ?Lesion #3 of concern was identified and located on mid right scalp.  Liquid nitrogen was applied to area of concern and extending out 1 millimeters beyond the border of the lesion.  Treated area was allowed to come back to room temperature before treating it a second time.   ? ?Lesion #4 of concern was identified and located on left dorsum of the forearm near elbow.  Liquid nitrogen was applied to area of concern and extending out 1 millimeters beyond the border of the lesion.  Treated area was allowed to come back to room temperature before treating it a second  time.   ? ?Lesion #5 of concern was identified and located on right forearm.  Liquid nitrogen was applied to area of concern and extending out 1 millimeters beyond the border of the lesion.  Treated area was allowed to come back to room temperature before treating it a second time.   ? ?Patient tolerated procedure well and there were no immediate complications.  Home care instructions were reviewed with the patient and a handout was provided. ? ?Assessment/ Plan: ?76 y.o. male  ? ?Actinic keratoses - Plan: CBC ? ?Vitamin D deficiency -  Plan: VITAMIN D 25 Hydroxy (Vit-D Deficiency, Fractures), Basic Metabolic Panel ? ?Elevated MCV - Plan: CBC, Basic Metabolic Panel ? ?Chronic right-sided low back pain without sciatica ? ?Radicular pain in right arm - Plan: celecoxib (CELEBREX) 100 MG capsule ? ?Actinic keratoses treated with cryotherapy.  See above procedure note.  No immediate complications.  On current structures were reviewed.  Follow-up as needed ? ?Given history of vitamin D deficiency we will recheck vitamin D.  Check BMP. ? ?Check CBC given history of elevated MCV ? ?He will contact me if he decides he wants to proceed with PT.  Otherwise we will continue Celebrex as needed ? ?Orders Placed This Encounter  ?Procedures  ? Varicella-zoster vaccine IM (Shingrix)  ? ?No orders of the defined types were placed in this encounter. ? ? ? ?Janora Norlander, DO ?[facility] ?[phone] ? ?

## 2021-11-21 LAB — CBC
Hematocrit: 42.9 % (ref 37.5–51.0)
Hemoglobin: 14.7 g/dL (ref 13.0–17.7)
MCH: 31.7 pg (ref 26.6–33.0)
MCHC: 34.3 g/dL (ref 31.5–35.7)
MCV: 93 fL (ref 79–97)
Platelets: 256 10*3/uL (ref 150–450)
RBC: 4.63 x10E6/uL (ref 4.14–5.80)
RDW: 11.7 % (ref 11.6–15.4)
WBC: 7.1 10*3/uL (ref 3.4–10.8)

## 2021-11-21 LAB — BASIC METABOLIC PANEL
BUN/Creatinine Ratio: 14 (ref 10–24)
BUN: 16 mg/dL (ref 8–27)
CO2: 23 mmol/L (ref 20–29)
Calcium: 9.4 mg/dL (ref 8.6–10.2)
Chloride: 104 mmol/L (ref 96–106)
Creatinine, Ser: 1.13 mg/dL (ref 0.76–1.27)
Glucose: 105 mg/dL — ABNORMAL HIGH (ref 70–99)
Potassium: 4.3 mmol/L (ref 3.5–5.2)
Sodium: 140 mmol/L (ref 134–144)
eGFR: 68 mL/min/{1.73_m2} (ref >59)

## 2021-11-21 LAB — VITAMIN D 25 HYDROXY (VIT D DEFICIENCY, FRACTURES): Vit D, 25-Hydroxy: 45.7 ng/mL (ref 30.0–100.0)

## 2022-03-04 ENCOUNTER — Other Ambulatory Visit: Payer: Self-pay | Admitting: Family Medicine

## 2022-03-04 DIAGNOSIS — M792 Neuralgia and neuritis, unspecified: Secondary | ICD-10-CM

## 2022-03-05 ENCOUNTER — Encounter: Payer: Self-pay | Admitting: Family Medicine

## 2022-03-05 ENCOUNTER — Ambulatory Visit (INDEPENDENT_AMBULATORY_CARE_PROVIDER_SITE_OTHER): Payer: Medicare Other | Admitting: Family Medicine

## 2022-03-05 VITALS — BP 151/80 | HR 83 | Temp 97.8°F | Ht 66.0 in | Wt 172.0 lb

## 2022-03-05 DIAGNOSIS — G8929 Other chronic pain: Secondary | ICD-10-CM

## 2022-03-05 DIAGNOSIS — Z23 Encounter for immunization: Secondary | ICD-10-CM | POA: Diagnosis not present

## 2022-03-05 DIAGNOSIS — M5441 Lumbago with sciatica, right side: Secondary | ICD-10-CM

## 2022-03-05 MED ORDER — MELOXICAM 7.5 MG PO TABS: 7.5000 mg | ORAL_TABLET | Freq: Every day | ORAL | 0 refills | Status: DC

## 2022-03-05 MED ORDER — FAMOTIDINE 20 MG PO TABS: 20.0000 mg | ORAL_TABLET | Freq: Two times a day (BID) | ORAL | 0 refills | Status: DC

## 2022-03-05 MED ORDER — METHYLPREDNISOLONE ACETATE 40 MG/ML IJ SUSP
40.0000 mg | Freq: Once | INTRAMUSCULAR | Status: AC
  Administered 2022-03-05: 40 mg via INTRAMUSCULAR

## 2022-03-05 NOTE — Progress Notes (Signed)
   Acute Office Visit  Subjective:     Patient ID: Raymond Morales, male    DOB: December 05, 1945, 76 y.o.   MRN: [id number]  Chief Complaint  Patient presents with   left knee pain    HPI Patient is in today for left leg pain x 1 week. He reports muscle soreness along the entirety of his leg. He has been riding his tractor a lot lately and has been keeping his leg up over the clutch ready to go and he wonders if this prompted his pain. The pain is worse with weight bearing or twisting his back. Denies numbness or tingling. He has been using a cane due to the pain. Ibuprofen was helping but bothersome to his stomach. He has also used a compression sleeve on his as well. He has a history of chronic back pain from a compression fracture. He reports that his back pain has been stable. Denies fever, saddle anesthesia, or changes in bowel or bladder control.   ROS As per HPI.      Objective:    BP (!) 151/80   Pulse 83   Temp 97.8 F (36.6 C)   Ht '5\' 6"'$  (1.676 m)   Wt 172 lb (78 kg)   SpO2 94%   BMI 27.76 kg/m    Physical Exam Vitals and nursing note reviewed.  Constitutional:      General: He is not in acute distress.    Appearance: He is not ill-appearing, toxic-appearing or diaphoretic.  Pulmonary:     Effort: Pulmonary effort is normal. No respiratory distress.  Musculoskeletal:     Lumbar back: Tenderness present. No swelling, edema, deformity, signs of trauma, lacerations, spasms or bony tenderness. Normal range of motion. Positive right straight leg raise test.     Left upper leg: Normal.     Left knee: Normal.     Right lower leg: No edema.     Left lower leg: Normal. No edema.  Skin:    General: Skin is warm and dry.  Neurological:     Mental Status: He is alert and oriented to person, place, and time.     Gait: Gait abnormal (anthalgic gait, using cane).  Psychiatric:        Mood and Affect: Mood normal.        Behavior: Behavior normal.     No results found for any  visits on 03/05/22.      Assessment & Plan:   Raymond Morales was seen today for left knee pain.  Diagnoses and all orders for this visit:  Chronic low back pain with right-sided sciatica, unspecified back pain laterality Increased symptoms x 1 week. Mobic daily, continue PPI and add pepcid BID if needed with mobic. Do not take other NSAIDs with mobic. Steroid IM injection today in office. Stretching, rest, heat. Strict return precautions given.  -     meloxicam (MOBIC) 7.5 MG tablet; Take 1 tablet (7.5 mg total) by mouth daily. -     famotidine (PEPCID) 20 MG tablet; Take 1 tablet (20 mg total) by mouth 2 (two) times daily. -     methylPREDNISolone acetate (DEPO-MEDROL) injection 40 mg  Need for vaccination -     Varicella-zoster vaccine IM (Shingrix)  Return if symptoms worsen or fail to improve.  The patient indicates understanding of these issues and agrees with the plan.   Raymond Perking, FNP

## 2022-03-05 NOTE — Patient Instructions (Signed)
Sciatica  Sciatica is pain, numbness, weakness, or tingling along the path of the sciatic nerve. The sciatic nerve starts in the lower back and runs down the back of each leg. The nerve controls the muscles in the lower leg and in the back of the knee. It also provides feeling (sensation) to the back of the thigh, the lower leg, and the sole of the foot. Sciatica is a symptom of another medical condition that pinches or puts pressure on the sciatic nerve. Sciatica most often only affects one side of the body. Sciatica usually goes away on its own or with treatment. In some cases, sciatica may come back (recur). What are the causes? This condition is caused by pressure on the sciatic nerve or pinching of the nerve. This may be the result of: A disk in between the bones of the spine bulging out too far (herniated disk). Age-related changes in the spinal disks. A pain disorder that affects a muscle in the buttock. Extra bone growth near the sciatic nerve. A break (fracture) of the pelvis. Pregnancy. Tumor. This is rare. What increases the risk? The following factors may make you more likely to develop this condition: Playing sports that place pressure or stress on the spine. Having poor strength and flexibility. A history of back injury or surgery. Sitting for long periods of time. Doing activities that involve repetitive bending or lifting. Obesity. What are the signs or symptoms? Symptoms can vary from mild to very severe, and they may include: Any of these problems in the lower back, leg, hip, or buttock: Mild tingling, numbness, or dull aches. Burning sensations. Sharp pains. Numbness in the back of the calf or the sole of the foot. Leg weakness. Severe back pain that makes movement difficult. Symptoms may get worse when you cough, sneeze, or laugh, or when you sit or stand for long periods of time. How is this diagnosed? This condition may be diagnosed based on: Your symptoms and  medical history. A physical exam. Blood tests. Imaging tests, such as: X-rays. MRI. CT scan. How is this treated? In many cases, this condition improves on its own without treatment. However, treatment may include: Reducing or modifying physical activity. Exercising and stretching. Icing and applying heat to the affected area. Medicines that help to: Relieve pain and swelling. Relax your muscles. Injections of medicines that help to relieve pain, irritation, and inflammation around the sciatic nerve (steroids). Surgery. Follow these instructions at home: Medicines Take over-the-counter and prescription medicines only as told by your health care provider. Ask your health care provider if the medicine prescribed to you: Requires you to avoid driving or using heavy machinery. Can cause constipation. You may need to take these actions to prevent or treat constipation: Drink enough fluid to keep your urine pale yellow. Take over-the-counter or prescription medicines. Eat foods that are high in fiber, such as beans, whole grains, and fresh fruits and vegetables. Limit foods that are high in fat and processed sugars, such as fried or sweet foods. Managing pain     If directed, put ice on the affected area. Put ice in a plastic bag. Place a towel between your skin and the bag. Leave the ice on for 20 minutes, 2-3 times a day. If directed, apply heat to the affected area. Use the heat source that your health care provider recommends, such as a moist heat pack or a heating pad. Place a towel between your skin and the heat source. Leave the heat on for  20-30 minutes. Remove the heat if your skin turns bright red. This is especially important if you are unable to feel pain, heat, or cold. You may have a greater risk of getting burned. Activity  Return to your normal activities as told by your health care provider. Ask your health care provider what activities are safe for you. Avoid  activities that make your symptoms worse. Take brief periods of rest throughout the day. When you rest for longer periods, mix in some mild activity or stretching between periods of rest. This will help to prevent stiffness and pain. Avoid sitting for long periods of time without moving. Get up and move around at least one time each hour. Exercise and stretch regularly, as told by your health care provider. Do not lift anything that is heavier than 10 lb (4.5 kg) while you have symptoms of sciatica. When you do not have symptoms, you should still avoid heavy lifting, especially repetitive heavy lifting. When you lift objects, always use proper lifting technique, which includes: Bending your knees. Keeping the load close to your body. Avoiding twisting. General instructions Maintain a healthy weight. Excess weight puts extra stress on your back. Wear supportive, comfortable shoes. Avoid wearing high heels. Avoid sleeping on a mattress that is too soft or too hard. A mattress that is firm enough to support your back when you sleep may help to reduce your pain. Keep all follow-up visits as told by your health care provider. This is important. Contact a health care provider if: You have pain that: Wakes you up when you are sleeping. Gets worse when you lie down. Is worse than you have experienced in the past. Lasts longer than 4 weeks. You have an unexplained weight loss. Get help right away if: You are not able to control when you urinate or have bowel movements (incontinence). You have: Weakness in your lower back, pelvis, buttocks, or legs that gets worse. Redness or swelling of your back. A burning sensation when you urinate. Summary Sciatica is pain, numbness, weakness, or tingling along the path of the sciatic nerve. This condition is caused by pressure on the sciatic nerve or pinching of the nerve. Sciatica can cause pain, numbness, or tingling in the lower back, legs, hips, and  buttocks. Treatment often includes rest, exercise, medicines, and applying ice or heat. This information is not intended to replace advice given to you by your health care provider. Make sure you discuss any questions you have with your health care provider. Document Revised: 08/30/2018 Document Reviewed: 08/30/2018 Elsevier Patient Education  [employer].

## 2022-03-20 ENCOUNTER — Encounter: Payer: Self-pay | Admitting: Family Medicine

## 2022-03-20 ENCOUNTER — Ambulatory Visit (INDEPENDENT_AMBULATORY_CARE_PROVIDER_SITE_OTHER): Payer: Medicare Other

## 2022-03-20 ENCOUNTER — Ambulatory Visit (INDEPENDENT_AMBULATORY_CARE_PROVIDER_SITE_OTHER): Payer: Medicare Other | Admitting: Family Medicine

## 2022-03-20 VITALS — BP 128/76 | HR 107 | Temp 97.9°F | Ht 66.0 in | Wt 170.1 lb

## 2022-03-20 DIAGNOSIS — H52203 Unspecified astigmatism, bilateral: Secondary | ICD-10-CM | POA: Diagnosis not present

## 2022-03-20 DIAGNOSIS — M25562 Pain in left knee: Secondary | ICD-10-CM

## 2022-03-20 DIAGNOSIS — H25093 Other age-related incipient cataract, bilateral: Secondary | ICD-10-CM | POA: Diagnosis not present

## 2022-03-20 DIAGNOSIS — H538 Other visual disturbances: Secondary | ICD-10-CM | POA: Diagnosis not present

## 2022-03-20 DIAGNOSIS — H524 Presbyopia: Secondary | ICD-10-CM | POA: Diagnosis not present

## 2022-03-20 DIAGNOSIS — H18521 Epithelial (juvenile) corneal dystrophy, right eye: Secondary | ICD-10-CM | POA: Diagnosis not present

## 2022-03-20 DIAGNOSIS — H5213 Myopia, bilateral: Secondary | ICD-10-CM | POA: Diagnosis not present

## 2022-03-20 NOTE — Patient Instructions (Signed)
Acute Knee Pain, Adult Acute knee pain is sudden and may be caused by damage, swelling, or irritation of the muscles and tissues that support the knee. Pain may result from: A fall. An injury to the knee from twisting motions. A hit to the knee. Infection. Acute knee pain may go away on its own with time and rest. If it does not, your health care provider may order tests to find the cause of the pain. These may include: Imaging tests, such as an X-ray, MRI, CT scan, or ultrasound. Joint aspiration. In this test, fluid is removed from the knee and evaluated. Arthroscopy. In this test, a lighted tube is inserted into the knee and an image is projected onto a TV screen. Biopsy. In this test, a sample of tissue is removed from the body and studied under a microscope. Follow these instructions at home: If you have a knee sleeve or brace:  Wear the knee sleeve or brace as told by your health care provider. Remove it only as told by your health care provider. Loosen it if your toes tingle, become numb, or turn cold and blue. Keep it clean. If the knee sleeve or brace is not waterproof: Do not let it get wet. Cover it with a watertight covering when you take a bath or shower. Activity Rest your knee. Do not do things that cause pain or make pain worse. Avoid high-impact activities or exercises, such as running, jumping rope, or doing jumping jacks. Work with a physical therapist to make a safe exercise program, as recommended by your health care provider. Do exercises as told by your physical therapist. Managing pain, stiffness, and swelling  If directed, put ice on the affected knee. To do this: If you have a removable knee sleeve or brace, remove it as told by your health care provider. Put ice in a plastic bag. Place a towel between your skin and the bag. Leave the ice on for 20 minutes, 2-3 times a day. Remove the ice if your skin turns bright red. This is very important. If you cannot  feel pain, heat, or cold, you have a greater risk of damage to the area. If directed, use an elastic bandage to put pressure (compression) on your injured knee. This may control swelling, give support, and help with discomfort. Raise (elevate) your knee above the level of your heart while you are sitting or lying down. Sleep with a pillow under your knee. General instructions Take over-the-counter and prescription medicines only as told by your health care provider. Do not use any products that contain nicotine or tobacco, such as cigarettes, e-cigarettes, and chewing tobacco. If you need help quitting, ask your health care provider. If you are overweight, work with your health care provider and a dietitian to set a weight-loss goal that is healthy and reasonable for you. Extra weight can put pressure on your knee. Pay attention to any changes in your symptoms. Keep all follow-up visits. This is important. Contact a health care provider if: Your knee pain continues, changes, or gets worse. You have a fever along with knee pain. Your knee feels warm to the touch or is red. Your knee buckles or locks up. Get help right away if: Your knee swells, and the swelling becomes worse. You cannot move your knee. You have severe pain in your knee that cannot be managed with pain medicine. Summary Acute knee pain can be caused by a fall, an injury, an infection, or damage, swelling, or irritation   of the tissues that support your knee. Your health care provider may perform tests to find out the cause of the pain. Pay attention to any changes in your symptoms. Relieve your pain with rest, medicines, light activity, and the use of ice. Get help right away if your knee swells, you cannot move your knee, or you have severe pain that cannot be managed with medicine. This information is not intended to replace advice given to you by your health care provider. Make sure you discuss any questions you have with  your health care provider. Document Revised: 01/25/2020 Document Reviewed: 01/25/2020 Elsevier Patient Education  2023 [employer]  

## 2022-03-20 NOTE — Progress Notes (Signed)
   Acute Office Visit  Subjective:     Patient ID: Raymond Morales, male    DOB: 1945/11/10, 76 y.o.   MRN: [id number]  Chief Complaint  Patient presents with   Knee Pain    Knee Pain  There was no injury mechanism. The pain is present in the left knee. The quality of the pain is described as aching. The pain is mild. The pain has been Constant since onset. Pertinent negatives include no inability to bear weight, loss of motion, loss of sensation, muscle weakness, numbness or tingling. He reports no foreign bodies present. The symptoms are aggravated by movement and weight bearing (twisting). He has tried NSAIDs, acetaminophen and immobilization (compression, brace, lidocaine) for the symptoms. The treatment provided mild relief.     Review of Systems  Constitutional:  Negative for chills and fever.  Musculoskeletal:  Negative for falls.  Skin:  Negative for rash.  Neurological:  Negative for tingling and numbness.        Objective:    BP 128/76 Comment: at home per pt  Pulse (!) 107   Temp 97.9 F (36.6 C) (Temporal)   Ht '5\' 6"'$  (1.676 m)   Wt 170 lb 2 oz (77.2 kg)   SpO2 97%   BMI 27.46 kg/m    Physical Exam Vitals and nursing note reviewed.  Pulmonary:     Effort: Pulmonary effort is normal. No respiratory distress.  Musculoskeletal:        General: No swelling.     Left knee: No swelling, deformity, effusion, erythema or ecchymosis. Normal range of motion. Tenderness present over the lateral joint line. No medial joint line or patellar tendon tenderness. Normal alignment and normal patellar mobility.     Instability Tests: Medial McMurray test negative and lateral McMurray test negative.     Right lower leg: No edema.     Left lower leg: No edema.  Skin:    General: Skin is warm and dry.  Neurological:     General: No focal deficit present.     Gait: Gait abnormal (antalgic).  Psychiatric:        Mood and Affect: Mood normal.        Behavior: Behavior normal.      No results found for any visits on 03/20/22.      Assessment & Plan:   Raymond Morales was seen today for knee pain.  Diagnoses and all orders for this visit:  Acute pain of left knee Tenderness of lateral joint line. No injury. ? OA vs meniscus pathology. Xray today in office, report pending. Will notify patient of results and any changes in plan of care pending radiology reports. Continue mobic, tylenol, lidocaine, brace prn.  -     DG Knee 1-2 Views Left; Future  Return if symptoms worsen or fail to improve.  The patient indicates understanding of these issues and agrees with the plan.  Gwenlyn Perking, FNP

## 2022-05-20 ENCOUNTER — Other Ambulatory Visit: Payer: Self-pay | Admitting: Family Medicine

## 2022-05-20 DIAGNOSIS — K219 Gastro-esophageal reflux disease without esophagitis: Secondary | ICD-10-CM

## 2022-05-21 ENCOUNTER — Encounter: Payer: Self-pay | Admitting: Family Medicine

## 2022-05-21 ENCOUNTER — Ambulatory Visit (INDEPENDENT_AMBULATORY_CARE_PROVIDER_SITE_OTHER): Payer: Medicare Other | Admitting: Family Medicine

## 2022-05-21 VITALS — BP 129/76 | HR 83 | Temp 98.4°F | Ht 66.0 in | Wt 167.8 lb

## 2022-05-21 DIAGNOSIS — E782 Mixed hyperlipidemia: Secondary | ICD-10-CM | POA: Diagnosis not present

## 2022-05-21 DIAGNOSIS — Z5320 Procedure and treatment not carried out because of patient's decision for unspecified reasons: Secondary | ICD-10-CM | POA: Diagnosis not present

## 2022-05-21 DIAGNOSIS — J310 Chronic rhinitis: Secondary | ICD-10-CM | POA: Diagnosis not present

## 2022-05-21 DIAGNOSIS — Z91038 Other insect allergy status: Secondary | ICD-10-CM | POA: Diagnosis not present

## 2022-05-21 DIAGNOSIS — M25562 Pain in left knee: Secondary | ICD-10-CM | POA: Diagnosis not present

## 2022-05-21 DIAGNOSIS — G8929 Other chronic pain: Secondary | ICD-10-CM

## 2022-05-21 DIAGNOSIS — R718 Other abnormality of red blood cells: Secondary | ICD-10-CM

## 2022-05-21 DIAGNOSIS — E559 Vitamin D deficiency, unspecified: Secondary | ICD-10-CM

## 2022-05-21 DIAGNOSIS — L570 Actinic keratosis: Secondary | ICD-10-CM

## 2022-05-21 DIAGNOSIS — K219 Gastro-esophageal reflux disease without esophagitis: Secondary | ICD-10-CM | POA: Diagnosis not present

## 2022-05-21 DIAGNOSIS — M792 Neuralgia and neuritis, unspecified: Secondary | ICD-10-CM

## 2022-05-21 DIAGNOSIS — J329 Chronic sinusitis, unspecified: Secondary | ICD-10-CM

## 2022-05-21 DIAGNOSIS — Z23 Encounter for immunization: Secondary | ICD-10-CM

## 2022-05-21 DIAGNOSIS — N400 Enlarged prostate without lower urinary tract symptoms: Secondary | ICD-10-CM

## 2022-05-21 MED ORDER — AZELASTINE HCL 0.1 % NA SOLN: 1.0000 | Freq: Two times a day (BID) | NASAL | 99 refills | Status: DC

## 2022-05-21 MED ORDER — CELECOXIB 100 MG PO CAPS: 100.0000 mg | ORAL_CAPSULE | Freq: Two times a day (BID) | ORAL | 4 refills | Status: DC | PRN

## 2022-05-21 MED ORDER — ATORVASTATIN CALCIUM 10 MG PO TABS: 10.0000 mg | ORAL_TABLET | Freq: Every day | ORAL | 3 refills | Status: DC

## 2022-05-21 MED ORDER — OMEPRAZOLE 20 MG PO CPDR: 20.0000 mg | DELAYED_RELEASE_CAPSULE | Freq: Every day | ORAL | 3 refills | Status: DC

## 2022-05-21 MED ORDER — FINASTERIDE 5 MG PO TABS: 5.0000 mg | ORAL_TABLET | Freq: Every day | ORAL | 3 refills | Status: DC

## 2022-05-21 MED ORDER — TRIAMCINOLONE ACETONIDE 0.1 % EX CREA: 1.0000 | TOPICAL_CREAM | Freq: Two times a day (BID) | CUTANEOUS | 0 refills | Status: DC

## 2022-05-21 MED ORDER — TAMSULOSIN HCL 0.4 MG PO CAPS: 0.4000 mg | ORAL_CAPSULE | Freq: Every day | ORAL | 3 refills | Status: DC

## 2022-05-21 NOTE — Progress Notes (Signed)
Raymond Morales is a 76 y.o. male presents to office today for annual physical exam examination.    Concerns today include: 1.  Hyperlipidemia Patient is compliant with Lipitor.  Needs refills.  No chest pain or shortness of breath.  2.  Left knee pain Patient reports that he has been having ongoing left knee pain but is about 95% better now.  Pain seems to be worse with pivoting motions.  He had plain films done which demonstrated no significant degenerative changes and it was thought to possibly be meniscal in nature.  He has been bracing the knee and using lidocaine as needed.  He also has Celebrex if needed but typically only uses this once a day but the rare twice daily dosing.  2.  Skin lesions Patient reports multiple skin lesions on the arms.  The ones that we previously treated with cryoablation have resolved but he has a few new ones that he would like me to look at.  Also has some on the scalp   Occupation: Retired  Diet: Balanced, Exercise: Stays active Last eye exam: Up-to-date  Last dental exam: Up to date Last colonoscopy: Refuses all colon cancer screening Refills needed today: All Immunizations needed: Flu shot Immunization History  Administered Date(s) Administered   Fluad Quad(high Dose 65+) 05/25/2019, 05/23/2020, 05/24/2021   Influenza, High Dose Seasonal PF 05/27/2017, 05/21/2018, 06/01/2018   Influenza,inj,Quad PF,6+ Mos 05/12/2016   Influenza-Unspecified 06/13/2013, 06/26/2014, 05/28/2015   Moderna SARS-COV2 Booster Vaccination 07/11/2020   Moderna Sars-Covid-2 Vaccination 09/18/2019, 10/19/2019, 07/24/2021   Pneumococcal Conjugate-13 08/04/2014   Pneumococcal Polysaccharide-23 08/06/2015   Zoster Recombinat (Shingrix) 11/20/2021, 03/05/2022   Zoster, Live 08/02/2013     Past Medical History:  Diagnosis Date   Allergy    seasonal   BPH (benign prostatic hypertrophy)    Hyperlipidemia    Social History   Socioeconomic History   Marital status:  Married    Spouse name: Izora Gala   Number of children: 2   Years of education: Not on file   Highest education level: Not on file  Occupational History   Not on file  Tobacco Use   Smoking status: Never   Smokeless tobacco: Never  Vaping Use   Vaping Use: Never used  Substance and Sexual Activity   Alcohol use: No   Drug use: No   Sexual activity: Never  Other Topics Concern   Not on file  Social History Narrative   1 daughter and 1 son   Married since 1974. 02/06/73   Social Determinants of Health   Financial Resource Strain: Low Risk  (09/11/2021)   Overall Financial Resource Strain (CARDIA)    Difficulty of Paying Living Expenses: Not hard at all  Food Insecurity: No Food Insecurity (09/11/2021)   Hunger Vital Sign    Worried About Running Out of Food in the Last Year: Never true    [place] in the Last Year: Never true  Transportation Needs: No Transportation Needs (09/11/2021)   PRAPARE - Hydrologist (Medical): No    Lack of Transportation (Non-Medical): No  Physical Activity: Insufficiently Active (09/11/2021)   Exercise Vital Sign    Days of Exercise per Week: 5 days    Minutes of Exercise per Session: 20 min  Stress: No Stress Concern Present (09/11/2021)   [place]onah    Feeling of Stress : Only a little  Social Connections: Socially Integrated (09/11/2021)  Social Licensed conveyancer [NHANES]    Frequency of Communication with Friends and Family: More than three times a week    Frequency of Social Gatherings with Friends and Family: More than three times a week    Attends Religious Services: More than 4 times per year    Active Member of [employer] or Organizations: Yes    Attends Archivist Meetings: More than 4 times per year    Marital Status: Married  Human resources officer Violence: Not At Risk (09/11/2021)   Humiliation, Afraid, Rape, and Kick  questionnaire    Fear of Current or Ex-Partner: No    Emotionally Abused: No    Physically Abused: No    Sexually Abused: No   History reviewed. No pertinent surgical history. Family History  Problem Relation Age of Onset   Hypertension Mother    Vision loss Mother    Atrial fibrillation Mother    Heart disease Father    Thyroid disease Father    Hypertension Sister    Atrial fibrillation Sister     Current Outpatient Medications:    atorvastatin (LIPITOR) 10 MG tablet, Take 1 tablet (10 mg total) by mouth daily., Disp: 90 tablet, Rfl: 3   azelastine (ASTELIN) 0.1 % nasal spray, Place 1 spray into both nostrils 2 (two) times daily., Disp: 30 mL, Rfl: prn   celecoxib (CELEBREX) 100 MG capsule, TAKE ONE CAPSULE BY MOUTH TWICE DAILY AS NEEDED., Disp: 60 capsule, Rfl: 2   famotidine (PEPCID) 20 MG tablet, Take 1 tablet (20 mg total) by mouth 2 (two) times daily., Disp: 30 tablet, Rfl: 0   finasteride (PROSCAR) 5 MG tablet, Take 1 tablet (5 mg total) by mouth daily., Disp: 90 tablet, Rfl: 3   loratadine (CLARITIN) 10 MG tablet, Take 10 mg by mouth daily., Disp: , Rfl:    meloxicam (MOBIC) 7.5 MG tablet, Take 1 tablet (7.5 mg total) by mouth daily., Disp: 30 tablet, Rfl: 0   omeprazole (PRILOSEC) 20 MG capsule, TAKE ONE CAPSULE BY MOUTH ONCE DAILY., Disp: 90 capsule, Rfl: 0   tamsulosin (FLOMAX) 0.4 MG CAPS capsule, Take 1 capsule (0.4 mg total) by mouth daily., Disp: 90 capsule, Rfl: 3   triamcinolone cream (KENALOG) 0.1 %, Apply 1 application topically 2 (two) times daily. x10 days (if needed for allergic rash), Disp: 45 g, Rfl: 0  Allergies  Allergen Reactions   Latex     Other reaction(s): Unknown     ROS: Review of Systems Pertinent items noted in HPI and remainder of comprehensive ROS otherwise negative.    Physical exam BP 129/76   Pulse 83   Temp 98.4 F (36.9 C)   Ht 5' 6"  (1.676 m)   Wt 167 lb 12.8 oz (76.1 kg)   SpO2 94%   BMI 27.08 kg/m  General appearance:  alert, cooperative, and appears stated age Head: Normocephalic, without obvious abnormality, atraumatic Eyes: negative findings: lids and lashes normal, conjunctivae and sclerae normal, corneas clear, and pupils equal, round, reactive to light and accomodation Ears: normal TM's and external ear canals both ears Nose: Nares normal. Septum midline. Mucosa normal. No drainage or sinus tenderness. Throat: lips, mucosa, and tongue normal; teeth and gums normal Neck: no adenopathy, no carotid bruit, supple, symmetrical, trachea midline, and thyroid not enlarged, symmetric, no tenderness/mass/nodules Back:  Mild scoliosis of the thoracic spine to the right appreciated Lungs: clear to auscultation bilaterally Chest wall: no tenderness Heart: regular rate and rhythm, S1, S2 normal, no murmur, click,  rub or gallop Abdomen: soft, non-tender; bowel sounds normal; no masses,  no organomegaly Extremities: extremities normal, atraumatic, no cyanosis or edema Pulses: 2+ and symmetric Skin:  Several actinic keratoses noted along the scalp and upper extremities.  He has several seborrheic keratoses as well Lymph nodes: Cervical, supraclavicular, and axillary nodes normal. Neurologic: Grossly normal MSK: No gross joint effusion or soft tissue swelling noted of the left knee.  Range of motion appears to be preserved.  Psych: Mood stable, speech normal, affect appropriate   Flowsheet Row Office Visit from 05/21/2022 in [facility]  PHQ-2 Total Score 0      Cryotherapy Procedure:  Risks and benefits of procedure were reviewed with the patient.  Written consent obtained and scanned into the chart.  Lesion of concern was identified and located on:   1. Parietal region of scalp x4 2. Left forearm (lateral) x1 3. Dorsum of right hand x3  Liquid nitrogen was applied to area of concern and extending out 1 millimeters beyond the border of the lesion.  Treated area was allowed to come back  to room temperature before treating it a second time.  Patient tolerated procedure well and there were no immediate complications.  Home care instructions were reviewed with the patient and a handout was provided.   Assessment/ Plan: [name] here for annual physical exam.   Mixed hyperlipidemia - Plan: CMP14+EGFR, Lipid Panel, atorvastatin (LIPITOR) 10 MG tablet  Benign prostatic hyperplasia without lower urinary tract symptoms - Plan: PSA, finasteride (PROSCAR) 5 MG tablet, tamsulosin (FLOMAX) 0.4 MG CAPS capsule  Colon cancer screening declined  Vitamin D deficiency - Plan: VITAMIN D 25 Hydroxy (Vit-D Deficiency, Fractures)  Elevated MCV - Plan: CBC  Rhinosinusitis - Plan: azelastine (ASTELIN) 0.1 % nasal spray  Radicular pain in right arm - Plan: celecoxib (CELEBREX) 100 MG capsule  Gastroesophageal reflux disease without esophagitis - Plan: omeprazole (PRILOSEC) 20 MG capsule  Allergic reaction to insect bite - Plan: triamcinolone cream (KENALOG) 0.1 %  Actinic keratoses  Need for immunization against influenza - Plan: Flu Vaccine QUAD High Dose(Fluad)  Chronic pain of left knee  Fasting labs have been collected.  Influenza vaccination administered.  He will continue statin, Proscar, Flomax and Celebrex as directed.  Check vitamin D level given history of vitamin D deficiency, check CBC given history of elevated MCV.  PPI renewed but caution with use and only use if needed  Triamcinolone renewed for as needed use.  No active lesions concerning for insect bites on exam but he did have several actinic keratosis which we treated with cryoablation today.  No immediate complications.    No gross swelling of the left knee appreciated on exam today.  He is ambulating independently.  Since his knee pain is relatively well trolled at this time no interventions are planned but we discussed that if he has a flareup going forward I am glad to offer corticosteroid injection as he really  does not want to see an orthopedist unless absolutely needed.  Understands return precautions and will follow-up in 6 to 12 months  Counseled on healthy lifestyle choices, including diet (rich in fruits, vegetables and lean meats and low in salt and simple carbohydrates) and exercise (at least 30 minutes of moderate physical activity daily).  Patient to follow up in 1 year for annual exam or sooner if needed.  Jola Critzer M. Lajuana Ripple, DO

## 2022-05-21 NOTE — Patient Instructions (Signed)
Preventive Care 65 Years and Older, Male Preventive care refers to lifestyle choices and visits with your health care provider that can promote health and wellness. Preventive care visits are also called wellness exams. What can I expect for my preventive care visit? Counseling During your preventive care visit, your health care provider may ask about your: Medical history, including: Past medical problems. Family medical history. History of falls. Current health, including: Emotional well-being. Home life and relationship well-being. Sexual activity. Memory and ability to understand (cognition). Lifestyle, including: Alcohol, nicotine or tobacco, and drug use. Access to firearms. Diet, exercise, and sleep habits. Work and work environment. Sunscreen use. Safety issues such as seatbelt and bike helmet use. Physical exam Your health care provider will check your: Height and weight. These may be used to calculate your BMI (body mass index). BMI is a measurement that tells if you are at a healthy weight. Waist circumference. This measures the distance around your waistline. This measurement also tells if you are at a healthy weight and may help predict your risk of certain diseases, such as type 2 diabetes and high blood pressure. Heart rate and blood pressure. Body temperature. Skin for abnormal spots. What immunizations do I need?  Vaccines are usually given at various ages, according to a schedule. Your health care provider will recommend vaccines for you based on your age, medical history, and lifestyle or other factors, such as travel or where you work. What tests do I need? Screening Your health care provider may recommend screening tests for certain conditions. This may include: Lipid and cholesterol levels. Diabetes screening. This is done by checking your blood sugar (glucose) after you have not eaten for a while (fasting). Hepatitis C test. Hepatitis B test. HIV (human  immunodeficiency virus) test. STI (sexually transmitted infection) testing, if you are at risk. Lung cancer screening. Colorectal cancer screening. Prostate cancer screening. Abdominal aortic aneurysm (AAA) screening. You may need this if you are a current or former smoker. Talk with your health care provider about your test results, treatment options, and if necessary, the need for more tests. Follow these instructions at home: Eating and drinking  Eat a diet that includes fresh fruits and vegetables, whole grains, lean protein, and low-fat dairy products. Limit your intake of foods with high amounts of sugar, saturated fats, and salt. Take vitamin and mineral supplements as recommended by your health care provider. Do not drink alcohol if your health care provider tells you not to drink. If you drink alcohol: Limit how much you have to 0-2 drinks a day. Know how much alcohol is in your drink. In the [place] one drink equals one 12 oz bottle of beer (355 mL), one 5 oz glass of wine (148 mL), or one 1 oz glass of hard liquor (44 mL). Lifestyle Brush your teeth every morning and night with fluoride toothpaste. Floss one time each day. Exercise for at least 30 minutes 5 or more days each week. Do not use any products that contain nicotine or tobacco. These products include cigarettes, chewing tobacco, and vaping devices, such as e-cigarettes. If you need help quitting, ask your health care provider. Do not use drugs. If you are sexually active, practice safe sex. Use a condom or other form of protection to prevent STIs. Take aspirin only as told by your health care provider. Make sure that you understand how much to take and what form to take. Work with your health care provider to find out whether it is safe   and beneficial for you to take aspirin daily. Ask your health care provider if you need to take a cholesterol-lowering medicine (statin). Find healthy ways to manage stress, such  as: Meditation, yoga, or listening to music. Journaling. Talking to a trusted person. Spending time with friends and family. Safety Always wear your seat belt while driving or riding in a vehicle. Do not drive: If you have been drinking alcohol. Do not ride with someone who has been drinking. When you are tired or distracted. While texting. If you have been using any mind-altering substances or drugs. Wear a helmet and other protective equipment during sports activities. If you have firearms in your house, make sure you follow all gun safety procedures. Minimize exposure to UV radiation to reduce your risk of skin cancer. What's next? Visit your health care provider once a year for an annual wellness visit. Ask your health care provider how often you should have your eyes and teeth checked. Stay up to date on all vaccines. This information is not intended to replace advice given to you by your health care provider. Make sure you discuss any questions you have with your health care provider. Document Revised: 02/06/2021 Document Reviewed: 02/06/2021 Elsevier Patient Education  2023 [employer] Inc[employer]  

## 2022-05-22 LAB — CMP14+EGFR
ALT: 20 IU/L (ref 0–44)
AST: 23 IU/L (ref 0–40)
Albumin/Globulin Ratio: 1.7 (ref 1.2–2.2)
Albumin: 4 g/dL (ref 3.8–4.8)
Alkaline Phosphatase: 126 IU/L — ABNORMAL HIGH (ref 44–121)
BUN/Creatinine Ratio: 15 (ref 10–24)
BUN: 16 mg/dL (ref 8–27)
Bilirubin Total: 1.1 mg/dL (ref 0.0–1.2)
CO2: 22 mmol/L (ref 20–29)
Calcium: 9.5 mg/dL (ref 8.6–10.2)
Chloride: 102 mmol/L (ref 96–106)
Creatinine, Ser: 1.06 mg/dL (ref 0.76–1.27)
Globulin, Total: 2.3 g/dL (ref 1.5–4.5)
Glucose: 103 mg/dL — ABNORMAL HIGH (ref 70–99)
Potassium: 4.1 mmol/L (ref 3.5–5.2)
Sodium: 138 mmol/L (ref 134–144)
Total Protein: 6.3 g/dL (ref 6.0–8.5)
eGFR: 73 mL/min/{1.73_m2} (ref >59)

## 2022-05-22 LAB — LIPID PANEL
Chol/HDL Ratio: 3.6 ratio (ref 0.0–5.0)
Cholesterol, Total: 154 mg/dL (ref 100–199)
HDL: 43 mg/dL (ref >39)
LDL Chol Calc (NIH): 87 mg/dL (ref 0–99)
Triglycerides: 134 mg/dL (ref 0–149)
VLDL Cholesterol Cal: 24 mg/dL (ref 5–40)

## 2022-05-22 LAB — CBC
Hematocrit: 40.7 % (ref 37.5–51.0)
Hemoglobin: 13.7 g/dL (ref 13.0–17.7)
MCH: 32.2 pg (ref 26.6–33.0)
MCHC: 33.7 g/dL (ref 31.5–35.7)
MCV: 96 fL (ref 79–97)
Platelets: 252 10*3/uL (ref 150–450)
RBC: 4.26 x10E6/uL (ref 4.14–5.80)
RDW: 11.7 % (ref 11.6–15.4)
WBC: 6.5 10*3/uL (ref 3.4–10.8)

## 2022-05-22 LAB — VITAMIN D 25 HYDROXY (VIT D DEFICIENCY, FRACTURES): Vit D, 25-Hydroxy: 73.9 ng/mL (ref 30.0–100.0)

## 2022-05-22 LAB — PSA: Prostate Specific Ag, Serum: 0.4 ng/mL (ref 0.0–4.0)

## 2022-05-23 ENCOUNTER — Ambulatory Visit: Payer: Medicare Other | Admitting: Family Medicine

## 2022-07-26 IMAGING — DX DG CERVICAL SPINE 2 OR 3 VIEWS
2 series · 2 of 2 positions shown · non-contrast
Comparison: None.

CLINICAL DATA: Right arm radiculopathy.  No known injury.

EXAM:
CERVICAL SPINE - 2-3 VIEW

[c-spine lat]
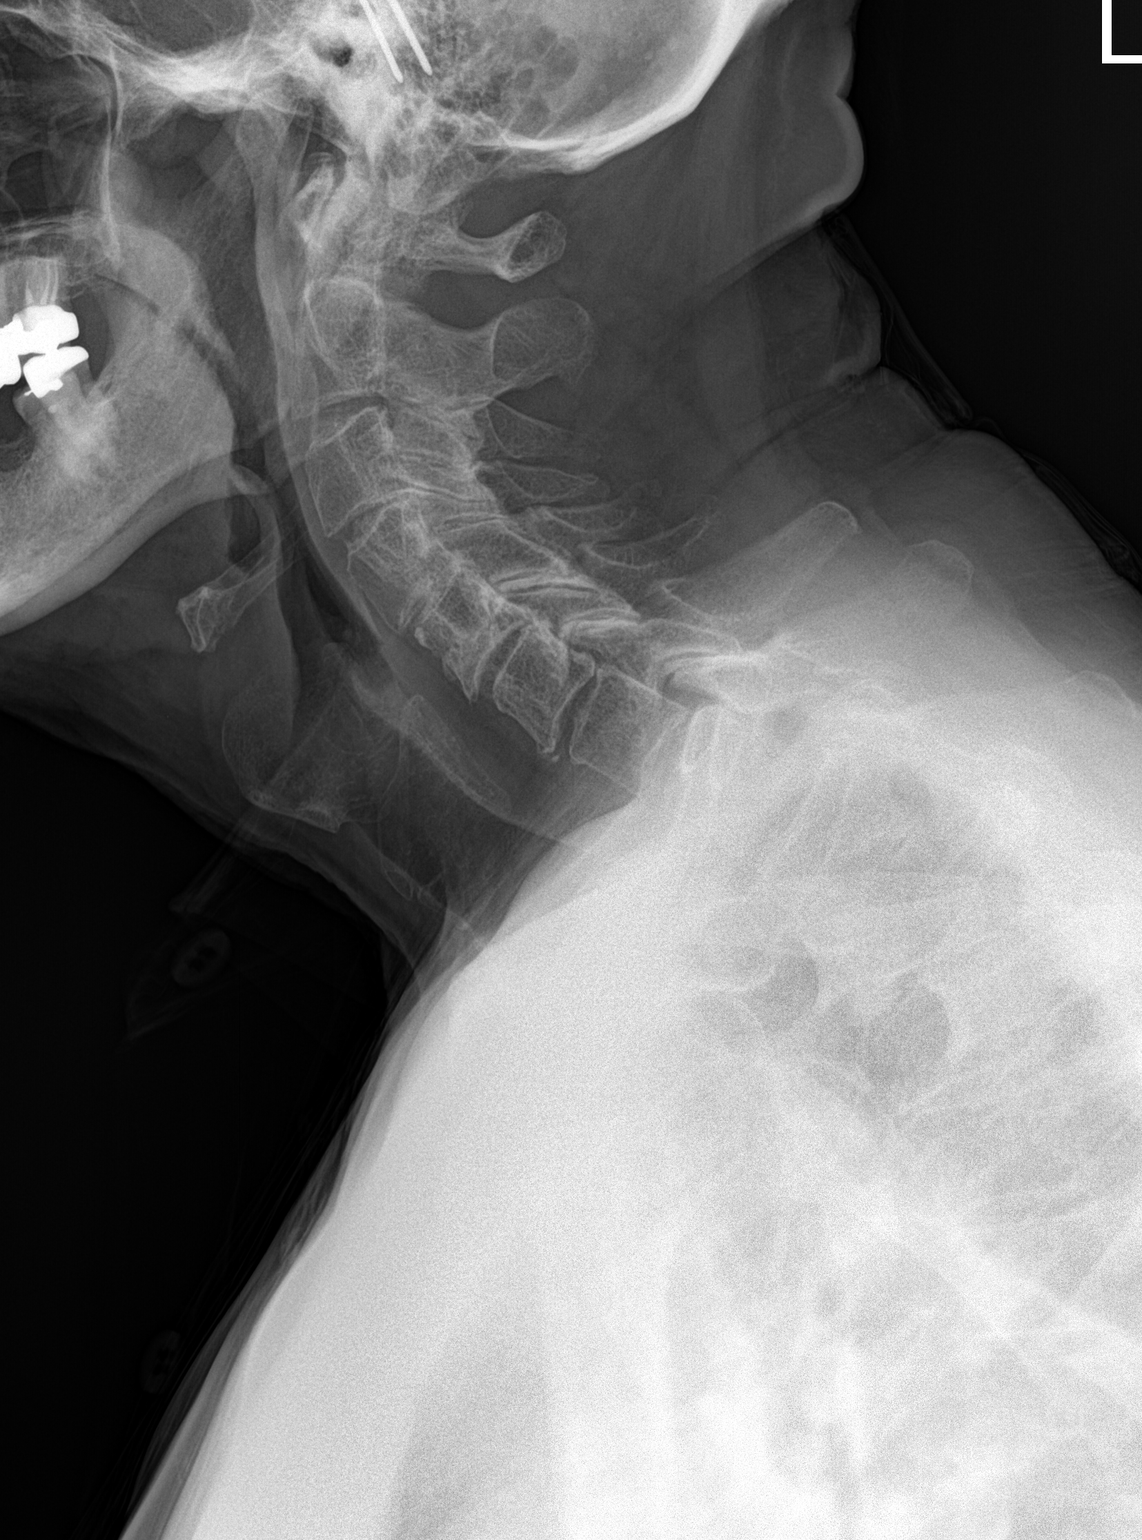

[c-spine ap]
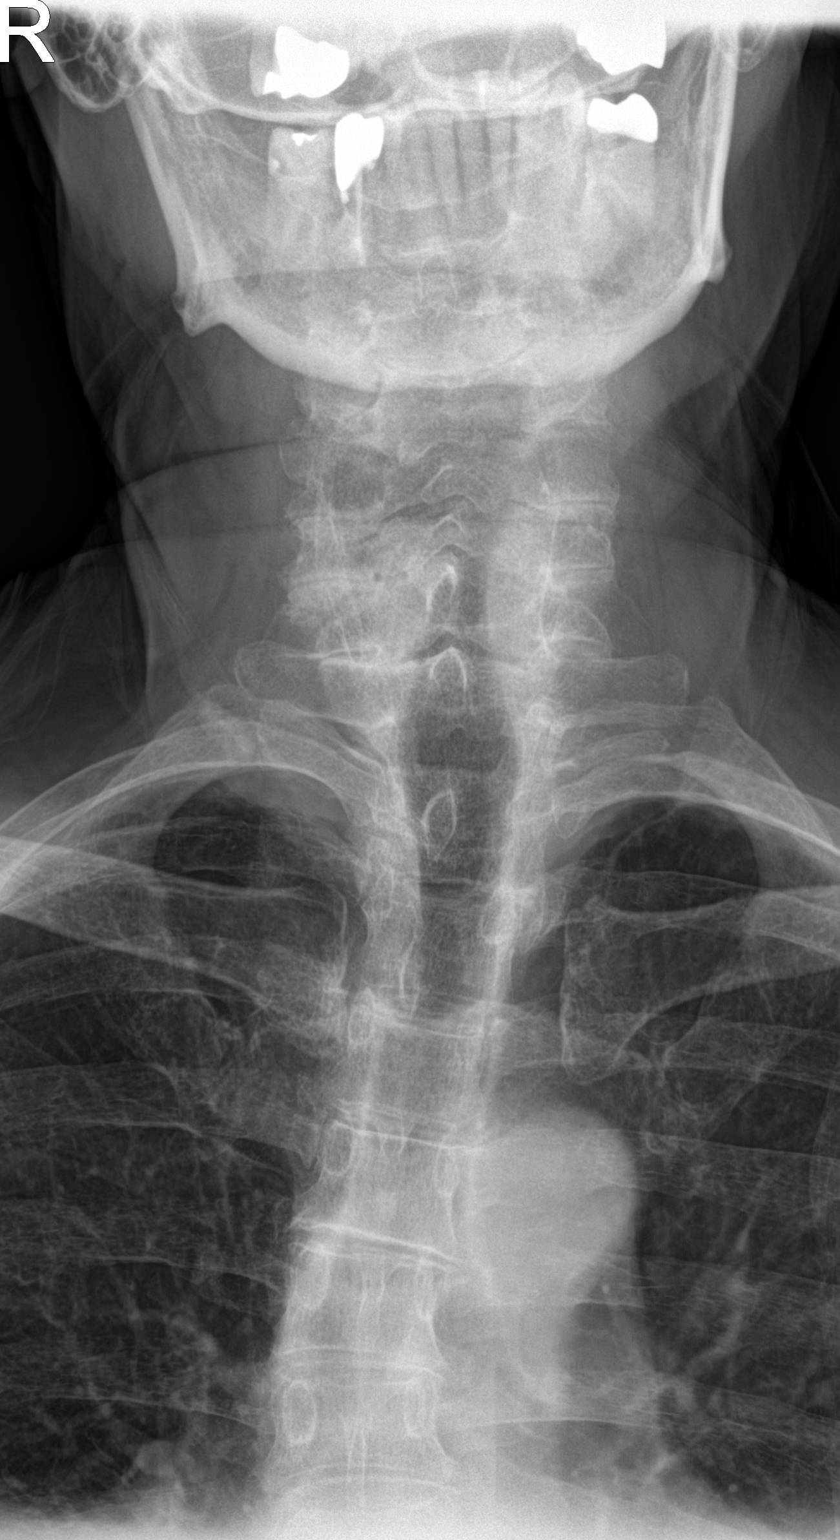

[2 of 2 positions shown; findings below may reference images not displayed]

FINDINGS: There is no fracture or malalignment. Multilevel loss of disc space
height appears worst at C3-4, C4-5 and C5-6. Scattered facet
degenerative change is noted. Prevertebral soft tissues are normal.
Lung apices are clear.
IMPRESSION: Multilevel degenerative disease.

## 2022-09-15 ENCOUNTER — Ambulatory Visit (INDEPENDENT_AMBULATORY_CARE_PROVIDER_SITE_OTHER): Payer: Medicare Other

## 2022-09-15 VITALS — Ht 66.0 in | Wt 167.0 lb

## 2022-09-15 DIAGNOSIS — Z0000 Encounter for general adult medical examination without abnormal findings: Secondary | ICD-10-CM | POA: Diagnosis not present

## 2022-09-15 NOTE — Progress Notes (Signed)
Subjective:   Raymond Morales is a 76 y.o. male who presents for Medicare Annual/Subsequent preventive examination. I connected with  Raymond Morales on 09/15/22 by a audio enabled telemedicine application and verified that I am speaking with the correct person using two identifiers.  Patient Location: Home  Provider Location: Home Office  I discussed the limitations of evaluation and management by telemedicine. The patient expressed understanding and agreed to proceed.  Review of Systems     Cardiac Risk Factors include: advanced age (>18mn, >>83women)     Objective:    Today's Vitals   09/15/22 0909  Weight: 167 lb (75.8 kg)  Height: '5\' 6"'$  (1.676 m)   Body mass index is 26.95 kg/m.     09/15/2022    9:14 AM 09/11/2021    9:12 AM 12/04/2020    9:07 AM 11/25/2016   11:31 AM  Advanced Directives  Does Patient Have a Medical Advance Directive? No No No No  Would patient like information on creating a medical advance directive? No - Patient declined No - Patient declined No - Patient declined Yes (MAU/Ambulatory/Procedural Areas - Information given)    Current Medications (verified) Outpatient Encounter Medications as of 09/15/2022  Medication Sig   atorvastatin (LIPITOR) 10 MG tablet Take 1 tablet (10 mg total) by mouth daily.   azelastine (ASTELIN) 0.1 % nasal spray Place 1 spray into both nostrils 2 (two) times daily.   celecoxib (CELEBREX) 100 MG capsule Take 1 capsule (100 mg total) by mouth 2 (two) times daily as needed.   finasteride (PROSCAR) 5 MG tablet Take 1 tablet (5 mg total) by mouth daily.   loratadine (CLARITIN) 10 MG tablet Take 10 mg by mouth daily.   omeprazole (PRILOSEC) 20 MG capsule Take 1 capsule (20 mg total) by mouth daily.   tamsulosin (FLOMAX) 0.4 MG CAPS capsule Take 1 capsule (0.4 mg total) by mouth daily.   triamcinolone cream (KENALOG) 0.1 % Apply 1 Application topically 2 (two) times daily. x10 days (if needed for allergic rash)   No  facility-administered encounter medications on file as of 09/15/2022.    Allergies (verified) Latex   History: Past Medical History:  Diagnosis Date   Allergy    seasonal   BPH (benign prostatic hypertrophy)    Hyperlipidemia    History reviewed. No pertinent surgical history. Family History  Problem Relation Age of Onset   Hypertension Mother    Vision loss Mother    Atrial fibrillation Mother    Heart disease Father    Thyroid disease Father    Hypertension Sister    Atrial fibrillation Sister    Social History   Socioeconomic History   Marital status: Married    Spouse name: NIzora Gala  Number of children: 2   Years of education: Not on file   Highest education level: Not on file  Occupational History   Not on file  Tobacco Use   Smoking status: Never   Smokeless tobacco: Never  Vaping Use   Vaping Use: Never used  Substance and Sexual Activity   Alcohol use: No   Drug use: No   Sexual activity: Never  Other Topics Concern   Not on file  Social History Narrative   1 daughter and 1 son   Married since 1974. 02/06/73   Social Determinants of Health   Financial Resource Strain: Low Risk  (09/15/2022)   Overall Financial Resource Strain (CARDIA)    Difficulty of Paying Living Expenses: Not hard at  all  Food Insecurity: No Food Insecurity (09/15/2022)   Hunger Vital Sign    Worried About Running Out of Food in the Last Year: Never true    Ran Out of Food in the Last Year: Never true  Transportation Needs: No Transportation Needs (09/15/2022)   PRAPARE - Hydrologist (Medical): No    Lack of Transportation (Non-Medical): No  Physical Activity: Insufficiently Active (09/15/2022)   Exercise Vital Sign    Days of Exercise per Week: 3 days    Minutes of Exercise per Session: 30 min  Stress: No Stress Concern Present (09/15/2022)   [place]    Feeling of Stress : Not at  all  Social Connections: Moderately Integrated (09/15/2022)   Social Connection and Isolation Panel [NHANES]    Frequency of Communication with Friends and Family: More than three times a week    Frequency of Social Gatherings with Friends and Family: More than three times a week    Attends Religious Services: More than 4 times per year    Active Member of [employer] or Organizations: No    Attends Music therapist: Never    Marital Status: Married    Tobacco Counseling Counseling given: Not Answered   Clinical Intake:  Pre-visit preparation completed: Yes  Pain : No/denies pain     Nutritional Risks: None Diabetes: No  How often do you need to have someone help you when you read instructions, pamphlets, or other written materials from your doctor or pharmacy?: 1 - Never  Diabetic?no   Interpreter Needed?: No  Information entered by :: Jadene Pierini, LPN   Activities of Daily Living    09/15/2022    9:14 AM  In your present state of health, do you have any difficulty performing the following activities:  Hearing? 0  Vision? 0  Difficulty concentrating or making decisions? 0  Walking or climbing stairs? 0  Dressing or bathing? 0  Doing errands, shopping? 0  Preparing Food and eating ? N  Using the Toilet? N  In the past six months, have you accidently leaked urine? N  Do you have problems with loss of bowel control? N  Managing your Medications? N  Managing your Finances? N  Housekeeping or managing your Housekeeping? N    Patient Care Team: Janora Norlander, DO as PCP - General (Family Medicine)  Indicate any recent Medical Services you may have received from other than [facility] providers in the past year (date may be approximate).     Assessment:   This is a routine wellness examination for Raymond Morales.  Hearing/Vision screen Vision Screening - Comments:: Wears rx glasses - up to date with routine eye exams with  Dr.LAW  Dietary issues and exercise  activities discussed: Current Exercise Habits: Home exercise routine, Type of exercise: walking, Time (Minutes): 30, Frequency (Times/Week): 3, Weekly Exercise (Minutes/Week): 90, Intensity: Mild, Exercise limited by: None identified   Goals Addressed             This Visit's Progress    Exercise 3x per week (30 min per time)   On track      Depression Screen    09/15/2022    9:13 AM 05/21/2022    8:13 AM 03/20/2022    2:09 PM 03/05/2022   10:34 AM 09/11/2021    9:07 AM 05/24/2021    8:15 AM 11/20/2020    7:58 AM  PHQ 2/9 Scores  PHQ - 2 Score 0 0 0 0 0 0 0  PHQ- 9 Score   0 0  0     Fall Risk    09/15/2022    9:12 AM 05/21/2022    8:13 AM 09/11/2021    9:13 AM 05/24/2021    9:03 AM 11/20/2020    7:57 AM  Fall Risk   Falls in the past year? 0 1 0 0 0  Number falls in past yr: 0 0 0    Injury with Fall? 0 0 0    Risk for fall due to : No Fall Risks  No Fall Risks    Follow up Falls prevention discussed  Falls prevention discussed      FALL RISK PREVENTION PERTAINING TO THE HOME:  Any stairs in or around the home? Yes  If so, are there any without handrails? No  Home free of loose throw rugs in walkways, pet beds, electrical cords, etc? Yes  Adequate lighting in your home to reduce risk of falls? Yes   ASSISTIVE DEVICES UTILIZED TO PREVENT FALLS:  Life alert? No  Use of a cane, walker or w/c? No  Grab bars in the bathroom? No  Shower chair or bench in shower? No  Elevated toilet seat or a handicapped toilet? No       11/25/2016    8:43 AM  MMSE - Mini Mental State Exam  Orientation to time 5  Orientation to Place 5  Registration 3  Attention/ Calculation 5  Recall 2  Language- name 2 objects 2  Language- repeat 1  Language- follow 3 step command 3  Language- read & follow direction 1  Write a sentence 1  Copy design 1  Total score 29        09/15/2022    9:15 AM 09/11/2021    9:16 AM  6CIT Screen  What Year? 0 points 0 points  What month? 0 points 0  points  What time? 0 points 0 points  Count back from 20 0 points 0 points  Months in reverse 0 points 0 points  Repeat phrase 0 points 0 points  Total Score 0 points 0 points    Immunizations Immunization History  Administered Date(s) Administered   Fluad Quad(high Dose 65+) 05/25/2019, 05/23/2020, 05/24/2021, 05/21/2022   Influenza, High Dose Seasonal PF 05/27/2017, 05/21/2018, 06/01/2018   Influenza,inj,Quad PF,6+ Mos 05/12/2016   Influenza-Unspecified 06/13/2013, 06/26/2014, 05/28/2015   Moderna SARS-COV2 Booster Vaccination 07/11/2020   Moderna Sars-Covid-2 Vaccination 09/18/2019, 10/19/2019, 07/24/2021   Pneumococcal Conjugate-13 08/04/2014   Pneumococcal Polysaccharide-23 08/06/2015   Zoster Recombinat (Shingrix) 11/20/2021, 03/05/2022   Zoster, Live 08/02/2013    TDAP status: Due, Education has been provided regarding the importance of this vaccine. Advised may receive this vaccine at local pharmacy or Health Dept. Aware to provide a copy of the vaccination record if obtained from local pharmacy or Health Dept. Verbalized acceptance and understanding.  Flu Vaccine status: Up to date  Pneumococcal vaccine status: Up to date  Covid-19 vaccine status: Completed vaccines  Qualifies for Shingles Vaccine? Yes   Zostavax completed Yes   Shingrix Completed?: Yes  Screening Tests Health Maintenance  Topic Date Due   DTaP/Tdap/Td (1 - Tdap) Never done   COVID-19 Vaccine (4 - 2023-24 season) 04/25/2022   Medicare Annual Wellness (AWV)  09/16/2023   Pneumonia Vaccine 89+ Years old  Completed   INFLUENZA VACCINE  Completed   Hepatitis C Screening  Completed   Zoster Vaccines- Shingrix  Completed  HPV VACCINES  Aged Out    Health Maintenance  Health Maintenance Due  Topic Date Due   DTaP/Tdap/Td (1 - Tdap) Never done   COVID-19 Vaccine (4 - 2023-24 season) 04/25/2022    Colorectal cancer screening: No longer required.   Lung Cancer Screening: (Low Dose CT Chest  recommended if Age 59-80 years, 30 pack-year currently smoking OR have quit w/in 15years.) does not qualify.   Lung Cancer Screening Referral: n/a  Additional Screening:  Hepatitis C Screening: does not qualify; Completed 08/06/2015  Vision Screening: Recommended annual ophthalmology exams for early detection of glaucoma and other disorders of the eye. Is the patient up to date with their annual eye exam?  Yes  Who is the provider or what is the name of the office in which the patient attends annual eye exams? Dr.Law  If pt is not established with a provider, would they like to be referred to a provider to establish care? No .   Dental Screening: Recommended annual dental exams for proper oral hygiene  Community Resource Referral / Chronic Care Management: CRR required this visit?  No   CCM required this visit?  No      Plan:     I have personally reviewed and noted the following in the patient's chart:   Medical and social history Use of alcohol, tobacco or illicit drugs  Current medications and supplements including opioid prescriptions. Patient is not currently taking opioid prescriptions. Functional ability and status Nutritional status Physical activity Advanced directives List of other physicians Hospitalizations, surgeries, and ER visits in previous 12 months Vitals Screenings to include cognitive, depression, and falls Referrals and appointments  In addition, I have reviewed and discussed with patient certain preventive protocols, quality metrics, and best practice recommendations. A written personalized care plan for preventive services as well as general preventive health recommendations were provided to patient.     Daphane Shepherd, LPN   [id number]/5320   Nurse Notes: Due TDAP Vaccine

## 2022-09-15 NOTE — Patient Instructions (Signed)
Raymond Morales , Thank you for taking time to come for your Medicare Wellness Visit. I appreciate your ongoing commitment to your health goals. Please review the following plan we discussed and let me know if I can assist you in the future.   These are the goals we discussed:  Goals      Exercise 3x per week (30 min per time)        This is a list of the screening recommended for you and due dates:  Health Maintenance  Topic Date Due   DTaP/Tdap/Td vaccine (1 - Tdap) Never done   COVID-19 Vaccine (4 - 2023-24 season) 04/25/2022   Medicare Annual Wellness Visit  09/16/2023   Pneumonia Vaccine  Completed   Flu Shot  Completed   Hepatitis C Screening: USPSTF Recommendation to screen - Ages 18-79 yo.  Completed   Zoster (Shingles) Vaccine  Completed   HPV Vaccine  Aged Out    Advanced directives: Advance directive discussed with you today. I have provided a copy for you to complete at home and have notarized. Once this is complete please bring a copy in to our office so we can scan it into your chart.   Conditions/risks identified: Aim for 30 minutes of exercise or brisk walking, 6-8 glasses of water, and 5 servings of fruits and vegetables each day.   Next appointment: Follow up in one year for your annual wellness visit.   Preventive Care 7 Years and Older, Male  Preventive care refers to lifestyle choices and visits with your health care provider that can promote health and wellness. What does preventive care include? A yearly physical exam. This is also called an annual well check. Dental exams once or twice a year. Routine eye exams. Ask your health care provider how often you should have your eyes checked. Personal lifestyle choices, including: Daily care of your teeth and gums. Regular physical activity. Eating a healthy diet. Avoiding tobacco and drug use. Limiting alcohol use. Practicing safe sex. Taking low doses of aspirin every day. Taking vitamin and mineral  supplements as recommended by your health care provider. What happens during an annual well check? The services and screenings done by your health care provider during your annual well check will depend on your age, overall health, lifestyle risk factors, and family history of disease. Counseling  Your health care provider may ask you questions about your: Alcohol use. Tobacco use. Drug use. Emotional well-being. Home and relationship well-being. Sexual activity. Eating habits. History of falls. Memory and ability to understand (cognition). Work and work Statistician. Screening  You may have the following tests or measurements: Height, weight, and BMI. Blood pressure. Lipid and cholesterol levels. These may be checked every 5 years, or more frequently if you are over 26 years old. Skin check. Lung cancer screening. You may have this screening every year starting at age 36 if you have a 30-pack-year history of smoking and currently smoke or have quit within the past 15 years. Fecal occult blood test (FOBT) of the stool. You may have this test every year starting at age 65. Flexible sigmoidoscopy or colonoscopy. You may have a sigmoidoscopy every 5 years or a colonoscopy every 10 years starting at age 71. Prostate cancer screening. Recommendations will vary depending on your family history and other risks. Hepatitis C blood test. Hepatitis B blood test. Sexually transmitted disease (STD) testing. Diabetes screening. This is done by checking your blood sugar (glucose) after you have not eaten for a while (  fasting). You may have this done every 1-3 years. Abdominal aortic aneurysm (AAA) screening. You may need this if you are a current or former smoker. Osteoporosis. You may be screened starting at age 44 if you are at high risk. Talk with your health care provider about your test results, treatment options, and if necessary, the need for more tests. Vaccines  Your health care provider  may recommend certain vaccines, such as: Influenza vaccine. This is recommended every year. Tetanus, diphtheria, and acellular pertussis (Tdap, Td) vaccine. You may need a Td booster every 10 years. Zoster vaccine. You may need this after age 60. Pneumococcal 13-valent conjugate (PCV13) vaccine. One dose is recommended after age 48. Pneumococcal polysaccharide (PPSV23) vaccine. One dose is recommended after age 74. Talk to your health care provider about which screenings and vaccines you need and how often you need them. This information is not intended to replace advice given to you by your health care provider. Make sure you discuss any questions you have with your health care provider. Document Released: 09/07/2015 Document Revised: 04/30/2016 Document Reviewed: 06/12/2015 Elsevier Interactive Patient Education  2017 [place] Prevention in the Home Falls can cause injuries. They can happen to people of all ages. There are many things you can do to make your home safe and to help prevent falls. What can I do on the outside of my home? Regularly fix the edges of walkways and driveways and fix any cracks. Remove anything that might make you trip as you walk through a door, such as a raised step or threshold. Trim any bushes or trees on the path to your home. Use bright outdoor lighting. Clear any walking paths of anything that might make someone trip, such as rocks or tools. Regularly check to see if handrails are loose or broken. Make sure that both sides of any steps have handrails. Any raised decks and porches should have guardrails on the edges. Have any leaves, snow, or ice cleared regularly. Use sand or salt on walking paths during winter. Clean up any spills in your garage right away. This includes oil or grease spills. What can I do in the bathroom? Use night lights. Install grab bars by the toilet and in the tub and shower. Do not use towel bars as grab bars. Use  non-skid mats or decals in the tub or shower. If you need to sit down in the shower, use a plastic, non-slip stool. Keep the floor dry. Clean up any water that spills on the floor as soon as it happens. Remove soap buildup in the tub or shower regularly. Attach bath mats securely with double-sided non-slip rug tape. Do not have throw rugs and other things on the floor that can make you trip. What can I do in the bedroom? Use night lights. Make sure that you have a light by your bed that is easy to reach. Do not use any sheets or blankets that are too big for your bed. They should not hang down onto the floor. Have a firm chair that has side arms. You can use this for support while you get dressed. Do not have throw rugs and other things on the floor that can make you trip. What can I do in the kitchen? Clean up any spills right away. Avoid walking on wet floors. Keep items that you use a lot in easy-to-reach places. If you need to reach something above you, use a strong step stool that has a grab bar.  Keep electrical cords out of the way. Do not use floor polish or wax that makes floors slippery. If you must use wax, use non-skid floor wax. Do not have throw rugs and other things on the floor that can make you trip. What can I do with my stairs? Do not leave any items on the stairs. Make sure that there are handrails on both sides of the stairs and use them. Fix handrails that are broken or loose. Make sure that handrails are as long as the stairways. Check any carpeting to make sure that it is firmly attached to the stairs. Fix any carpet that is loose or worn. Avoid having throw rugs at the top or bottom of the stairs. If you do have throw rugs, attach them to the floor with carpet tape. Make sure that you have a light switch at the top of the stairs and the bottom of the stairs. If you do not have them, ask someone to add them for you. What else can I do to help prevent falls? Wear  shoes that: Do not have high heels. Have rubber bottoms. Are comfortable and fit you well. Are closed at the toe. Do not wear sandals. If you use a stepladder: Make sure that it is fully opened. Do not climb a closed stepladder. Make sure that both sides of the stepladder are locked into place. Ask someone to hold it for you, if possible. Clearly mark and make sure that you can see: Any grab bars or handrails. First and last steps. Where the edge of each step is. Use tools that help you move around (mobility aids) if they are needed. These include: Canes. Walkers. Scooters. Crutches. Turn on the lights when you go into a dark area. Replace any light bulbs as soon as they burn out. Set up your furniture so you have a clear path. Avoid moving your furniture around. If any of your floors are uneven, fix them. If there are any pets around you, be aware of where they are. Review your medicines with your doctor. Some medicines can make you feel dizzy. This can increase your chance of falling. Ask your doctor what other things that you can do to help prevent falls. This information is not intended to replace advice given to you by your health care provider. Make sure you discuss any questions you have with your health care provider. Document Released: 06/07/2009 Document Revised: 01/17/2016 Document Reviewed: 09/15/2014 Elsevier Interactive Patient Education  2017 [employer].

## 2022-11-14 ENCOUNTER — Telehealth: Payer: Self-pay | Admitting: Family Medicine

## 2022-11-14 NOTE — Telephone Encounter (Signed)
Contacted Raymond Morales to schedule their annual wellness visit. Appointment made for 09/17/2023. Thank you,  Colletta [place],  [facility] Direct Dial ??[phone]

## 2022-11-19 ENCOUNTER — Encounter: Payer: Self-pay | Admitting: Family Medicine

## 2022-11-19 ENCOUNTER — Ambulatory Visit (INDEPENDENT_AMBULATORY_CARE_PROVIDER_SITE_OTHER): Payer: Medicare Other | Admitting: Family Medicine

## 2022-11-19 VITALS — BP 139/83 | HR 85 | Temp 98.6°F | Ht 66.0 in | Wt 167.0 lb

## 2022-11-19 DIAGNOSIS — G8929 Other chronic pain: Secondary | ICD-10-CM | POA: Diagnosis not present

## 2022-11-19 DIAGNOSIS — Z91038 Other insect allergy status: Secondary | ICD-10-CM

## 2022-11-19 DIAGNOSIS — M25562 Pain in left knee: Secondary | ICD-10-CM | POA: Diagnosis not present

## 2022-11-19 DIAGNOSIS — L570 Actinic keratosis: Secondary | ICD-10-CM | POA: Diagnosis not present

## 2022-11-19 MED ORDER — TRIAMCINOLONE ACETONIDE 0.1 % EX CREA: 1.0000 | TOPICAL_CREAM | Freq: Two times a day (BID) | CUTANEOUS | 1 refills | Status: DC

## 2022-11-19 NOTE — Progress Notes (Addendum)
Subjective: CC: Skin lesions PCP: Janora Norlander, DO [id number] Raymond Morales is a 77 y.o. male presenting to clinic today for:  1.  Skin lesions Patient reports multiple skin lesions that he would like me to evaluate.  Has history of actinic keratosis which previously required cryoablation.  He does not wear sunscreen and frequently works outside but he does try to wear long sleeves and hats when he can.  He has a spot on his neck that he would like me to look at that has some pigment  2.  Left knee pain Patient reports that left knee pain is fairly well-controlled.  He has no gross swelling but he does report instability and he worries about this.  He remains very physically active and does not want to fall.  He uses a Celebrex daily and will use up to twice daily if needed but this is rare.  3.  Allergic reaction to insect bites Patient reports that given the upcoming spring months he will need a refill on triamcinolone because he will have allergic reactions to insect bites and poison oak frequently   ROS: Per HPI  Allergies  Allergen Reactions   Latex     Other reaction(s): Unknown   Past Medical History:  Diagnosis Date   Allergy    seasonal   BPH (benign prostatic hypertrophy)    Hyperlipidemia     Current Outpatient Medications:    atorvastatin (LIPITOR) 10 MG tablet, Take 1 tablet (10 mg total) by mouth daily., Disp: 90 tablet, Rfl: 3   azelastine (ASTELIN) 0.1 % nasal spray, Place 1 spray into both nostrils 2 (two) times daily., Disp: 30 mL, Rfl: prn   celecoxib (CELEBREX) 100 MG capsule, Take 1 capsule (100 mg total) by mouth 2 (two) times daily as needed., Disp: 60 capsule, Rfl: 4   finasteride (PROSCAR) 5 MG tablet, Take 1 tablet (5 mg total) by mouth daily., Disp: 90 tablet, Rfl: 3   loratadine (CLARITIN) 10 MG tablet, Take 10 mg by mouth daily., Disp: , Rfl:    omeprazole (PRILOSEC) 20 MG capsule, Take 1 capsule (20 mg total) by mouth daily., Disp: 90 capsule, Rfl:  3   tamsulosin (FLOMAX) 0.4 MG CAPS capsule, Take 1 capsule (0.4 mg total) by mouth daily., Disp: 90 capsule, Rfl: 3   triamcinolone cream (KENALOG) 0.1 %, Apply 1 Application topically 2 (two) times daily. x10 days (if needed for allergic rash), Disp: 45 g, Rfl: 0 Social History   Socioeconomic History   Marital status: Married    Spouse name: Izora Gala   Number of children: 2   Years of education: Not on file   Highest education level: Not on file  Occupational History   Not on file  Tobacco Use   Smoking status: Never   Smokeless tobacco: Never  Vaping Use   Vaping Use: Never used  Substance and Sexual Activity   Alcohol use: No   Drug use: No   Sexual activity: Never  Other Topics Concern   Not on file  Social History Narrative   1 daughter and 1 son   Married since 1974. 02/06/73   Social Determinants of Health   Financial Resource Strain: Low Risk  (09/15/2022)   Overall Financial Resource Strain (CARDIA)    Difficulty of Paying Living Expenses: Not hard at all  Food Insecurity: No Food Insecurity (09/15/2022)   Hunger Vital Sign    Worried About Running Out of Food in the Last Year: Never true  Ran Out of Food in the Last Year: Never true  Transportation Needs: No Transportation Needs (09/15/2022)   PRAPARE - Hydrologist (Medical): No    Lack of Transportation (Non-Medical): No  Physical Activity: Insufficiently Active (09/15/2022)   Exercise Vital Sign    Days of Exercise per Week: 3 days    Minutes of Exercise per Session: 30 min  Stress: No Stress Concern Present (09/15/2022)   Evaro    Feeling of Stress : Not at all  Social Connections: Moderately Integrated (09/15/2022)   Social Connection and Isolation Panel [NHANES]    Frequency of Communication with Friends and Family: More than three times a week    Frequency of Social Gatherings with Friends and Family: More  than three times a week    Attends Religious Services: More than 4 times per year    Active Member of [employer] or Organizations: No    Attends Archivist Meetings: Never    Marital Status: Married  Human resources officer Violence: Not At Risk (09/15/2022)   Humiliation, Afraid, Rape, and Kick questionnaire    Fear of Current or Ex-Partner: No    Emotionally Abused: No    Physically Abused: No    Sexually Abused: No   Family History  Problem Relation Age of Onset   Hypertension Mother    Vision loss Mother    Atrial fibrillation Mother    Heart disease Father    Thyroid disease Father    Hypertension Sister    Atrial fibrillation Sister     Objective: Office vital signs reviewed. BP 139/83   Pulse 85   Temp 98.6 F (37 C)   Ht 5\' 6"  (1.676 m)   Wt 167 lb (75.8 kg)   SpO2 96%   BMI 26.95 kg/m   Physical Examination:  General: Awake, alert, well nourished, No acute distress HEENT: 5 actinic keratoses noted along the posterior, apex of the scalp.  Cardio: regular rate and rhythm  Pulm:  normal work of breathing on room air MSK: Ambulating independently with normal gait and station.  No gross swelling or deformity appreciated of left knee.  His active range of motion is intact. Skin: Actinic keratoses as above.  No rashes appreciated.  He has a seborrheic keratosis noted along the nape of the neck and along the right temple.  Multiple solar lentigo noted throughout upper extremities   Cryotherapy Procedure:  Risks and benefits of procedure were reviewed with the patient.  Written consent obtained and scanned into the chart.  Lesions (5 in total) of concern was identified and located on apex of posterior scalp.  Liquid nitrogen was applied to area of concern and extending out 1 millimeters beyond the border of the lesion.  Treated area was allowed to come back to room temperature before treating it a second time.  Patient tolerated procedure well and there were no immediate  complications.  Home care instructions were reviewed with the patient   Assessment/ Plan: 77 y.o. male   Actinic keratoses  Chronic pain of left knee  Allergic reaction to insect bite - Plan: triamcinolone cream (KENALOG) 0.1 %  Actinic keratoses treated x 5 with cryoablation.  Home care instructions were reviewed and reasons for reevaluation discussed.  Left knee is not really having any flareup of pain right now.  Celebrex daily seems to be controlling.  Discussed consideration for physical therapy to strengthen that knee and reduce  instability.  He does not want to start this just yet but we will reconvene in 6 months, sooner if concerns arise  No active lesions but given spring months he always has recurrent insect bites which become very irritated and refractory to OTC management of triamcinolone given   No orders of the defined types were placed in this encounter.  No orders of the defined types were placed in this encounter.    Janora Norlander, DO [facility] [phone]

## 2023-03-24 DIAGNOSIS — H25093 Other age-related incipient cataract, bilateral: Secondary | ICD-10-CM | POA: Diagnosis not present

## 2023-03-24 DIAGNOSIS — H18521 Epithelial (juvenile) corneal dystrophy, right eye: Secondary | ICD-10-CM | POA: Diagnosis not present

## 2023-03-24 DIAGNOSIS — H52203 Unspecified astigmatism, bilateral: Secondary | ICD-10-CM | POA: Diagnosis not present

## 2023-03-24 DIAGNOSIS — H524 Presbyopia: Secondary | ICD-10-CM | POA: Diagnosis not present

## 2023-03-24 DIAGNOSIS — H538 Other visual disturbances: Secondary | ICD-10-CM | POA: Diagnosis not present

## 2023-03-24 DIAGNOSIS — H5213 Myopia, bilateral: Secondary | ICD-10-CM | POA: Diagnosis not present

## 2023-05-05 ENCOUNTER — Other Ambulatory Visit: Payer: Self-pay | Admitting: Family Medicine

## 2023-05-05 DIAGNOSIS — M792 Neuralgia and neuritis, unspecified: Secondary | ICD-10-CM

## 2023-05-22 ENCOUNTER — Ambulatory Visit (INDEPENDENT_AMBULATORY_CARE_PROVIDER_SITE_OTHER): Payer: Medicare Other | Admitting: Family Medicine

## 2023-05-22 ENCOUNTER — Encounter: Payer: Self-pay | Admitting: Family Medicine

## 2023-05-22 VITALS — BP 139/75 | HR 60 | Temp 98.4°F | Ht 66.0 in | Wt 155.0 lb

## 2023-05-22 DIAGNOSIS — K219 Gastro-esophageal reflux disease without esophagitis: Secondary | ICD-10-CM

## 2023-05-22 DIAGNOSIS — J329 Chronic sinusitis, unspecified: Secondary | ICD-10-CM

## 2023-05-22 DIAGNOSIS — M25562 Pain in left knee: Secondary | ICD-10-CM

## 2023-05-22 DIAGNOSIS — Z23 Encounter for immunization: Secondary | ICD-10-CM

## 2023-05-22 DIAGNOSIS — E782 Mixed hyperlipidemia: Secondary | ICD-10-CM

## 2023-05-22 DIAGNOSIS — M792 Neuralgia and neuritis, unspecified: Secondary | ICD-10-CM

## 2023-05-22 DIAGNOSIS — N400 Enlarged prostate without lower urinary tract symptoms: Secondary | ICD-10-CM

## 2023-05-22 DIAGNOSIS — G8929 Other chronic pain: Secondary | ICD-10-CM | POA: Diagnosis not present

## 2023-05-22 DIAGNOSIS — Z91038 Other insect allergy status: Secondary | ICD-10-CM | POA: Diagnosis not present

## 2023-05-22 DIAGNOSIS — E559 Vitamin D deficiency, unspecified: Secondary | ICD-10-CM

## 2023-05-22 DIAGNOSIS — R002 Palpitations: Secondary | ICD-10-CM

## 2023-05-22 DIAGNOSIS — L570 Actinic keratosis: Secondary | ICD-10-CM

## 2023-05-22 LAB — LIPID PANEL

## 2023-05-22 MED ORDER — OMEPRAZOLE 20 MG PO CPDR
20.0000 mg | DELAYED_RELEASE_CAPSULE | Freq: Every day | ORAL | 3 refills | Status: DC
Start: 2023-05-22 — End: 2024-05-23

## 2023-05-22 MED ORDER — TRIAMCINOLONE ACETONIDE 0.1 % EX CREA
1.0000 | TOPICAL_CREAM | Freq: Two times a day (BID) | CUTANEOUS | 1 refills | Status: DC
Start: 2023-05-22 — End: 2024-05-23

## 2023-05-22 MED ORDER — FINASTERIDE 5 MG PO TABS
5.0000 mg | ORAL_TABLET | Freq: Every day | ORAL | 3 refills | Status: DC
Start: 2023-05-22 — End: 2024-05-23

## 2023-05-22 MED ORDER — CELECOXIB 100 MG PO CAPS
100.0000 mg | ORAL_CAPSULE | Freq: Every day | ORAL | 3 refills | Status: DC | PRN
Start: 2023-05-22 — End: 2024-05-23

## 2023-05-22 MED ORDER — TAMSULOSIN HCL 0.4 MG PO CAPS
0.4000 mg | ORAL_CAPSULE | Freq: Every day | ORAL | 3 refills | Status: DC
Start: 2023-05-22 — End: 2024-05-23

## 2023-05-22 MED ORDER — AZELASTINE HCL 0.1 % NA SOLN
1.0000 | Freq: Two times a day (BID) | NASAL | 99 refills | Status: DC
Start: 2023-05-22 — End: 2024-05-23

## 2023-05-22 MED ORDER — ATORVASTATIN CALCIUM 10 MG PO TABS
10.0000 mg | ORAL_TABLET | Freq: Every day | ORAL | 3 refills | Status: DC
Start: 2023-05-22 — End: 2024-05-23

## 2023-05-22 NOTE — Progress Notes (Signed)
Subjective: CC: Checkup PCP: Raliegh Ip, DO MVH:[id number] Borsuk is a 77 y.o. male presenting to clinic today for:  1.  Hyperlipidemia Patient is compliant with Lipitor.  He reports no chest pain, shortness of breath or change in exercise tolerance.  He does report occasional heart palpitations and this has been a chronic issue for him.  He would like to have an EKG updated today since it has been many years since his last.  He has previously had cardiac evaluation and does not want to go any further than just a simple EKG today  2.  Right shoulder and left knee pain Patient is not quite ready for physical therapy but wanted to make note of his chronic right shoulder issues.  He suspects that the left knee is indeed meniscal as it seems to be more bothersome with pivoting.  Not ready to see a physical therapist nor an orthopedist but will call if needed.  He is compliant with Celebrex 100 mg daily as needed and this seems to be working pretty well for him.  No bleeding reported.  Compliant with PPI  3.  BPH Patient compliant with Proscar and Flomax and this does work well to help him with urination.  No hematuria reported.  4.  Allergic rhinitis Compliant with both Astelin and Claritin.  Utilizing Astelin only once per day does report some occasional breakthrough postnasal drainage.     ROS: Per HPI  Allergies  Allergen Reactions   Latex     Other reaction(s): Unknown   Past Medical History:  Diagnosis Date   Allergy    seasonal   BPH (benign prostatic hypertrophy)    Hyperlipidemia     Current Outpatient Medications:    loratadine (CLARITIN) 10 MG tablet, Take 10 mg by mouth daily., Disp: , Rfl:    atorvastatin (LIPITOR) 10 MG tablet, Take 1 tablet (10 mg total) by mouth daily., Disp: 100 tablet, Rfl: 3   azelastine (ASTELIN) 0.1 % nasal spray, Place 1 spray into both nostrils 2 (two) times daily., Disp: 30 mL, Rfl: prn   celecoxib (CELEBREX) 100 MG capsule, Take 1  capsule (100 mg total) by mouth daily as needed for moderate pain (take with food)., Disp: 100 capsule, Rfl: 3   finasteride (PROSCAR) 5 MG tablet, Take 1 tablet (5 mg total) by mouth daily., Disp: 100 tablet, Rfl: 3   omeprazole (PRILOSEC) 20 MG capsule, Take 1 capsule (20 mg total) by mouth daily., Disp: 100 capsule, Rfl: 3   tamsulosin (FLOMAX) 0.4 MG CAPS capsule, Take 1 capsule (0.4 mg total) by mouth daily., Disp: 100 capsule, Rfl: 3   triamcinolone cream (KENALOG) 0.1 %, Apply 1 Application topically 2 (two) times daily. x10 days (if needed for allergic rash), Disp: 45 g, Rfl: 1 Social History   Socioeconomic History   Marital status: Married    Spouse name: Harriett Sine   Number of children: 2   Years of education: Not on file   Highest education level: Bachelor's degree (e.g., BA, AB, BS)  Occupational History   Not on file  Tobacco Use   Smoking status: Never   Smokeless tobacco: Never  Vaping Use   Vaping status: Never Used  Substance and Sexual Activity   Alcohol use: No   Drug use: No   Sexual activity: Never  Other Topics Concern   Not on file  Social History Narrative   1 daughter and 1 son   Married since 1974. 02/06/73   Social Determinants  of Health   Financial Resource Strain: Low Risk  (05/16/2023)   Overall Financial Resource Strain (CARDIA)    Difficulty of Paying Living Expenses: Not hard at all  Food Insecurity: No Food Insecurity (05/16/2023)   Hunger Vital Sign    Worried About Running Out of Food in the Last Year: Never true    Ran Out of Food in the Last Year: Never true  Transportation Needs: No Transportation Needs (05/16/2023)   PRAPARE - Administrator, Civil Service (Medical): No    Lack of Transportation (Non-Medical): No  Physical Activity: Insufficiently Active (05/16/2023)   Exercise Vital Sign    Days of Exercise per Week: 4 days    Minutes of Exercise per Session: 30 min  Stress: No Stress Concern Present (05/16/2023)   [employer] of Occupational Health - Occupational Stress Questionnaire    Feeling of Stress : Only a little  Social Connections: Socially Integrated (05/16/2023)   Social Connection and Isolation Panel [NHANES]    Frequency of Communication with Friends and Family: More than three times a week    Frequency of Social Gatherings with Friends and Family: More than three times a week    Attends Religious Services: More than 4 times per year    Active Member of [employer] or Organizations: Yes    Attends Engineer, structural: More than 4 times per year    Marital Status: Married  Catering manager Violence: Not At Risk (09/15/2022)   Humiliation, Afraid, Rape, and Kick questionnaire    Fear of Current or Ex-Partner: No    Emotionally Abused: No    Physically Abused: No    Sexually Abused: No   Family History  Problem Relation Age of Onset   Hypertension Mother    Vision loss Mother    Atrial fibrillation Mother    Heart disease Father    Thyroid disease Father    Hypertension Sister    Atrial fibrillation Sister     Objective: Office vital signs reviewed. BP 139/75   Pulse 60   Temp 98.4 F (36.9 C)   Ht 5\' 6"  (1.676 m)   Wt 155 lb (70.3 kg)   SpO2 95%   BMI 25.02 kg/m   Physical Examination:  General: Awake, alert, well nourished, No acute distress HEENT: Normal    Neck: No masses palpated. No lymphadenopathy    Ears: Tympanic membranes intact, normal light reflex, no erythema, no bulging    Eyes: PERRLA, extraocular membranes intact, sclera white    Nose: nasal turbinates moist, clear nasal discharge    Throat: moist mucus membranes, no erythema, no tonsillar exudate.  Airway is patent Cardio: regular rate and rhythm, S1S2 heard, no murmurs appreciated Pulm: clear to auscultation bilaterally, no wheezes, rhonchi or rales; normal work of breathing on room air Extremities: warm, well perfused, No edema, cyanosis or clubbing; +2 pulses bilaterally MSK: normal gait and  station     05/22/2023    8:05 AM 11/19/2022    7:59 AM 09/15/2022    9:13 AM  Depression screen PHQ 2/9  Decreased Interest 0 0 0  Down, Depressed, Hopeless 0 0 0  PHQ - 2 Score 0 0 0  Altered sleeping 0 0   Tired, decreased energy 0 0   Change in appetite 0 0   Feeling bad or failure about yourself  0 0   Trouble concentrating 0 0   Moving slowly or fidgety/restless 0 0   Suicidal thoughts 0  0   PHQ-9 Score 0 0   Difficult doing work/chores Not difficult at all Not difficult at all    Assessment/ Plan: 77 y.o. male   Heart palpitations - Plan: EKG 12-Lead, CMP14+EGFR, TSH, Magnesium, CBC  Mixed hyperlipidemia - Plan: atorvastatin (LIPITOR) 10 MG tablet, EKG 12-Lead, CMP14+EGFR, Lipid Panel, TSH  Radicular pain in right arm - Plan: celecoxib (CELEBREX) 100 MG capsule  Chronic pain of left knee - Plan: celecoxib (CELEBREX) 100 MG capsule  Allergic reaction to insect bite - Plan: triamcinolone cream (KENALOG) 0.1 %  Rhinosinusitis - Plan: azelastine (ASTELIN) 0.1 % nasal spray  Gastroesophageal reflux disease without esophagitis - Plan: omeprazole (PRILOSEC) 20 MG capsule  Benign prostatic hyperplasia without lower urinary tract symptoms - Plan: finasteride (PROSCAR) 5 MG tablet, tamsulosin (FLOMAX) 0.4 MG CAPS capsule, PSA  Vitamin D deficiency - Plan: VITAMIN D 25 Hydroxy (Vit-D Deficiency, Fractures)  EKG obtained.  No arrhythmia observed.  Check magnesium, TSH and CBC  Fasting labs collected.  Continue statin.  Celebrex renewed.  Caution given cardiovascular risk associated.  Use only as needed.  I have changed his prescription reflect current usage  Triamcinolone renewed.  Not having any active allergy to insect bite would like to have on hand  Astelin renewed and discussed that he could use 2 sprays in each nostril daily if he would like.  Okay to continue Claritin  GERD is chronic and stable with PPI.  Continue as long as he is using NSAID as above  BPH is  chronic and stable.  Check PSA.  Proscar and Flomax renewed  Check vitamin D given history of deficiency  Influenza vaccination administered   Nafisa Olds Hulen Skains, DO [facility] [phone]

## 2023-05-23 LAB — CBC
Hematocrit: 42.4 % (ref 37.5–51.0)
Hemoglobin: 14 g/dL (ref 13.0–17.7)
MCH: 32.4 pg (ref 26.6–33.0)
MCHC: 33 g/dL (ref 31.5–35.7)
MCV: 98 fL — ABNORMAL HIGH (ref 79–97)
Platelets: 263 10*3/uL (ref 150–450)
RBC: 4.32 x10E6/uL (ref 4.14–5.80)
RDW: 11.9 % (ref 11.6–15.4)
WBC: 6.8 10*3/uL (ref 3.4–10.8)

## 2023-05-23 LAB — CMP14+EGFR
ALT: 23 IU/L (ref 0–44)
AST: 28 IU/L (ref 0–40)
Albumin: 3.9 g/dL (ref 3.8–4.8)
Alkaline Phosphatase: 122 IU/L — ABNORMAL HIGH (ref 44–121)
BUN/Creatinine Ratio: 16 (ref 10–24)
BUN: 18 mg/dL (ref 8–27)
Bilirubin Total: 0.8 mg/dL (ref 0.0–1.2)
CO2: 22 mmol/L (ref 20–29)
Calcium: 9.5 mg/dL (ref 8.6–10.2)
Chloride: 104 mmol/L (ref 96–106)
Creatinine, Ser: 1.11 mg/dL (ref 0.76–1.27)
Globulin, Total: 2.5 g/dL (ref 1.5–4.5)
Glucose: 103 mg/dL — ABNORMAL HIGH (ref 70–99)
Potassium: 4.2 mmol/L (ref 3.5–5.2)
Sodium: 140 mmol/L (ref 134–144)
Total Protein: 6.4 g/dL (ref 6.0–8.5)
eGFR: 69 mL/min/{1.73_m2} (ref >59)

## 2023-05-23 LAB — VITAMIN D 25 HYDROXY (VIT D DEFICIENCY, FRACTURES): Vit D, 25-Hydroxy: 52.1 ng/mL (ref 30.0–100.0)

## 2023-05-23 LAB — PSA: Prostate Specific Ag, Serum: 0.4 ng/mL (ref 0.0–4.0)

## 2023-05-23 LAB — LIPID PANEL
Cholesterol, Total: 141 mg/dL (ref 100–199)
HDL: 47 mg/dL (ref >39)
LDL CALC COMMENT:: 3 ratio (ref 0.0–5.0)
LDL Chol Calc (NIH): 74 mg/dL (ref 0–99)
Triglycerides: 113 mg/dL (ref 0–149)
VLDL Cholesterol Cal: 20 mg/dL (ref 5–40)

## 2023-05-23 LAB — TSH: TSH: 2.3 u[IU]/mL (ref 0.450–4.500)

## 2023-05-23 LAB — MAGNESIUM: Magnesium: 2.1 mg/dL (ref 1.6–2.3)

## 2023-05-25 DIAGNOSIS — Z23 Encounter for immunization: Secondary | ICD-10-CM | POA: Diagnosis not present

## 2023-07-11 DIAGNOSIS — Y939 Activity, unspecified: Secondary | ICD-10-CM | POA: Diagnosis not present

## 2023-07-11 DIAGNOSIS — W230XXA Caught, crushed, jammed, or pinched between moving objects, initial encounter: Secondary | ICD-10-CM | POA: Diagnosis not present

## 2023-07-11 DIAGNOSIS — S63278A Dislocation of unspecified interphalangeal joint of other finger, initial encounter: Secondary | ICD-10-CM | POA: Diagnosis not present

## 2023-07-11 DIAGNOSIS — M1811 Unilateral primary osteoarthritis of first carpometacarpal joint, right hand: Secondary | ICD-10-CM | POA: Diagnosis not present

## 2023-07-11 DIAGNOSIS — M20091 Other deformity of right finger(s): Secondary | ICD-10-CM | POA: Diagnosis not present

## 2023-07-11 DIAGNOSIS — M19041 Primary osteoarthritis, right hand: Secondary | ICD-10-CM | POA: Diagnosis not present

## 2023-07-11 DIAGNOSIS — M79644 Pain in right finger(s): Secondary | ICD-10-CM | POA: Diagnosis not present

## 2023-07-11 DIAGNOSIS — Y929 Unspecified place or not applicable: Secondary | ICD-10-CM | POA: Diagnosis not present

## 2023-07-11 DIAGNOSIS — Y999 Unspecified external cause status: Secondary | ICD-10-CM | POA: Diagnosis not present

## 2023-08-11 ENCOUNTER — Telehealth: Payer: Self-pay | Admitting: Family Medicine

## 2023-08-11 ENCOUNTER — Ambulatory Visit (INDEPENDENT_AMBULATORY_CARE_PROVIDER_SITE_OTHER): Payer: Medicare Other | Admitting: Family Medicine

## 2023-08-11 VITALS — BP 148/73 | HR 77 | Temp 98.9°F | Ht 66.0 in | Wt 160.0 lb

## 2023-08-11 DIAGNOSIS — U071 COVID-19: Secondary | ICD-10-CM | POA: Diagnosis not present

## 2023-08-11 MED ORDER — MOLNUPIRAVIR EUA 200MG CAPSULE
4.0000 | ORAL_CAPSULE | Freq: Two times a day (BID) | ORAL | 0 refills | Status: AC
Start: 2023-08-11 — End: 2023-08-16

## 2023-08-11 NOTE — Telephone Encounter (Signed)
Apt scheduled.  

## 2023-08-11 NOTE — Telephone Encounter (Signed)
Copied from CRM [id number]. Topic: Clinical - Medical Advice >> Aug 11, 2023  8:21 AM Gaetano Hawthorne wrote: Reason for CRM: Patient tested positive for COVID on Sunday - he would like to know if the office could prescribe the anti-viral for him rather than him coming down to the office.

## 2023-08-11 NOTE — Progress Notes (Signed)
Subjective:  Patient ID: Raymond Morales, male    DOB: [birth year], 77 y.o.   MRN: [id number]  Patient Care Team: Raymond Ip, DO as PCP - General (Family Medicine)   Chief Complaint:  home covid test positive (Would like anti-viral)   HPI: Raymond Morales is a 77 y.o. male presenting on 08/11/2023 for home covid test positive (Would like anti-viral) He states that he took a home test on Sunday evening and was positive states that he has congestion and rhinorrhea. Reports that he has a mild sore throat and cough. Reports that he cannot sleep at night due to the congestion. He continues to take claritin and astelin, but is having little improvement. He is not taking anything OTC except for tylenol.   Relevant past medical, surgical, family, and social history reviewed and updated as indicated.  Allergies and medications reviewed and updated. Data reviewed: Chart in Epic.   Past Medical History:  Diagnosis Date   Allergy    seasonal   BPH (benign prostatic hypertrophy)    Hyperlipidemia     No past surgical history on file.  Social History   Socioeconomic History   Marital status: Married    Spouse name: Harriett Sine   Number of children: 2   Years of education: Not on file   Highest education level: Bachelor's degree (e.g., BA, AB, BS)  Occupational History   Not on file  Tobacco Use   Smoking status: Never   Smokeless tobacco: Never  Vaping Use   Vaping status: Never Used  Substance and Sexual Activity   Alcohol use: No   Drug use: No   Sexual activity: Never  Other Topics Concern   Not on file  Social History Narrative   1 daughter and 1 son   Married since 1974. 02/06/73   Social Drivers of Health   Financial Resource Strain: Low Risk  (08/11/2023)   Overall Financial Resource Strain (CARDIA)    Difficulty of Paying Living Expenses: Not hard at all  Food Insecurity: No Food Insecurity (08/11/2023)   Hunger Vital Sign    Worried About Running Out of Food in  the Last Year: Never true    Ran Out of Food in the Last Year: Never true  Transportation Needs: No Transportation Needs (08/11/2023)   PRAPARE - Administrator, Civil Service (Medical): No    Lack of Transportation (Non-Medical): No  Physical Activity: Inactive (08/11/2023)   Exercise Vital Sign    Days of Exercise per Week: 0 days    Minutes of Exercise per Session: 30 min  Stress: No Stress Concern Present (08/11/2023)   [employer] of Occupational Health - Occupational Stress Questionnaire    Feeling of Stress : Only a little  Social Connections: Socially Integrated (08/11/2023)   Social Connection and Isolation Panel [NHANES]    Frequency of Communication with Friends and Family: More than three times a week    Frequency of Social Gatherings with Friends and Family: More than three times a week    Attends Religious Services: More than 4 times per year    Active Member of [employer] or Organizations: Yes    Attends Banker Meetings: More than 4 times per year    Marital Status: Married  Catering manager Violence: Not At Risk (09/15/2022)   Humiliation, Afraid, Rape, and Kick questionnaire    Fear of Current or Ex-Partner: No    Emotionally Abused: No    Physically Abused: No  Sexually Abused: No    Outpatient Encounter Medications as of 08/11/2023  Medication Sig   atorvastatin (LIPITOR) 10 MG tablet Take 1 tablet (10 mg total) by mouth daily.   azelastine (ASTELIN) 0.1 % nasal spray Place 1 spray into both nostrils 2 (two) times daily.   celecoxib (CELEBREX) 100 MG capsule Take 1 capsule (100 mg total) by mouth daily as needed for moderate pain (take with food).   finasteride (PROSCAR) 5 MG tablet Take 1 tablet (5 mg total) by mouth daily.   loratadine (CLARITIN) 10 MG tablet Take 10 mg by mouth daily.   omeprazole (PRILOSEC) 20 MG capsule Take 1 capsule (20 mg total) by mouth daily.   tamsulosin (FLOMAX) 0.4 MG CAPS capsule Take 1 capsule (0.4 mg  total) by mouth daily.   triamcinolone cream (KENALOG) 0.1 % Apply 1 Application topically 2 (two) times daily. x10 days (if needed for allergic rash)   No facility-administered encounter medications on file as of 08/11/2023.    Allergies  Allergen Reactions   Latex     Other reaction(s): Unknown    Review of Systems As per HPI  Objective:  BP (!) 148/73   Pulse 77   Temp 98.9 F (37.2 C)   Ht 5\' 6"  (1.676 m)   Wt 160 lb (72.6 kg)   SpO2 95%   BMI 25.82 kg/m    Wt Readings from Last 3 Encounters:  08/11/23 160 lb (72.6 kg)  05/22/23 155 lb (70.3 kg)  11/19/22 167 lb (75.8 kg)    Physical Exam Constitutional:      General: He is awake. He is not in acute distress.    Appearance: Normal appearance. He is well-developed and well-groomed. He is not ill-appearing, toxic-appearing or diaphoretic.     Interventions: He is not intubated. HENT:     Nose: Congestion and rhinorrhea present. Rhinorrhea is clear.     Right Sinus: No maxillary sinus tenderness or frontal sinus tenderness.     Left Sinus: No maxillary sinus tenderness or frontal sinus tenderness.  Cardiovascular:     Rate and Rhythm: Normal rate and regular rhythm.     Pulses: Normal pulses.          Radial pulses are 2+ on the right side and 2+ on the left side.       Posterior tibial pulses are 2+ on the right side and 2+ on the left side.     Heart sounds: Normal heart sounds. No murmur heard.    No gallop.  Pulmonary:     Effort: Pulmonary effort is normal. No tachypnea, bradypnea, accessory muscle usage, prolonged expiration, respiratory distress or retractions. He is not intubated.     Breath sounds: Normal breath sounds. No stridor, decreased air movement or transmitted upper airway sounds. No decreased breath sounds, wheezing, rhonchi or rales.  Musculoskeletal:     Cervical back: Full passive range of motion without pain and neck supple.     Right lower leg: No edema.     Left lower leg: No edema.   Lymphadenopathy:     Head:     Right side of head: No submental, submandibular, tonsillar, preauricular or posterior auricular adenopathy.     Left side of head: No submental, submandibular, tonsillar, preauricular or posterior auricular adenopathy.  Skin:    General: Skin is warm.     Capillary Refill: Capillary refill takes less than 2 seconds.  Neurological:     General: No focal deficit present.  Mental Status: He is alert, oriented to person, place, and time and easily aroused. Mental status is at baseline.     GCS: GCS eye subscore is 4. GCS verbal subscore is 5. GCS motor subscore is 6.     Motor: No weakness.  Psychiatric:        Attention and Perception: Attention and perception normal.        Mood and Affect: Mood and affect normal.        Speech: Speech normal.        Behavior: Behavior normal. Behavior is cooperative.        Thought Content: Thought content normal. Thought content does not include homicidal or suicidal ideation. Thought content does not include homicidal or suicidal plan.        Cognition and Memory: Cognition and memory normal.        Judgment: Judgment normal.     Results for orders placed or performed in visit on 05/22/23  CMP14+EGFR   Collection Time: 05/22/23  8:41 AM  Result Value Ref Range   Glucose 103 (H) 70 - 99 mg/dL   BUN 18 8 - 27 mg/dL   Creatinine, Ser 1.61 0.76 - 1.27 mg/dL   eGFR 69 >09 UE/AVW/0.98   BUN/Creatinine Ratio 16 10 - 24   Sodium 140 134 - 144 mmol/L   Potassium 4.2 3.5 - 5.2 mmol/L   Chloride 104 96 - 106 mmol/L   CO2 22 20 - 29 mmol/L   Calcium 9.5 8.6 - 10.2 mg/dL   Total Protein 6.4 6.0 - 8.5 g/dL   Albumin 3.9 3.8 - 4.8 g/dL   Globulin, Total 2.5 1.5 - 4.5 g/dL   Bilirubin Total 0.8 0.0 - 1.2 mg/dL   Alkaline Phosphatase 122 (H) 44 - 121 IU/L   AST 28 0 - 40 IU/L   ALT 23 0 - 44 IU/L  Lipid Panel   Collection Time: 05/22/23  8:41 AM  Result Value Ref Range   Cholesterol, Total 141 100 - 199 mg/dL    Triglycerides 119 0 - 149 mg/dL   HDL 47 >14 mg/dL   VLDL Cholesterol Cal 20 5 - 40 mg/dL   LDL Chol Calc (NIH) 74 0 - 99 mg/dL   Chol/HDL Ratio 3.0 0.0 - 5.0 ratio  PSA   Collection Time: 05/22/23  8:41 AM  Result Value Ref Range   Prostate Specific Ag, Serum 0.4 0.0 - 4.0 ng/mL  TSH   Collection Time: 05/22/23  8:41 AM  Result Value Ref Range   TSH 2.300 0.450 - 4.500 uIU/mL  Magnesium   Collection Time: 05/22/23  8:41 AM  Result Value Ref Range   Magnesium 2.1 1.6 - 2.3 mg/dL  CBC   Collection Time: 05/22/23  8:41 AM  Result Value Ref Range   WBC 6.8 3.4 - 10.8 x10E3/uL   RBC 4.32 4.14 - 5.80 x10E6/uL   Hemoglobin 14.0 13.0 - 17.7 g/dL   Hematocrit 78.2 95.6 - 51.0 %   MCV 98 (H) 79 - 97 fL   MCH 32.4 26.6 - 33.0 pg   MCHC 33.0 31.5 - 35.7 g/dL   RDW 21.3 08.6 - 57.8 %   Platelets 263 150 - 450 x10E3/uL  VITAMIN D 25 Hydroxy (Vit-D Deficiency, Fractures)   Collection Time: 05/22/23  8:41 AM  Result Value Ref Range   Vit D, 25-Hydroxy 52.1 30.0 - 100.0 ng/mL       05/22/2023    8:05 AM 11/19/2022  7:59 AM 09/15/2022    9:13 AM 05/21/2022    8:13 AM 03/20/2022    2:09 PM  Depression screen PHQ 2/9  Decreased Interest 0 0 0 0 0  Down, Depressed, Hopeless 0 0 0 0 0  PHQ - 2 Score 0 0 0 0 0  Altered sleeping 0 0   0  Tired, decreased energy 0 0   0  Change in appetite 0 0   0  Feeling bad or failure about yourself  0 0   0  Trouble concentrating 0 0   0  Moving slowly or fidgety/restless 0 0   0  Suicidal thoughts 0 0   0  PHQ-9 Score 0 0   0  Difficult doing work/chores Not difficult at all Not difficult at all   Not difficult at all       05/22/2023    8:05 AM 11/19/2022    7:59 AM 05/21/2022    8:13 AM 03/05/2022   10:35 AM  GAD 7 : Generalized Anxiety Score  Nervous, Anxious, on Edge 0 0 0 0  Control/stop worrying 0 0 0 0  Worry too much - different things 0 0 0 0  Trouble relaxing 0 0 0 0  Restless 0 0 0 0  Easily annoyed or irritable 0 0 0 0  Afraid  - awful might happen 0 0 0 0  Total GAD 7 Score 0 0 0 0  Anxiety Difficulty  Not difficult at all Not difficult at all Not difficult at all   Pertinent labs & imaging results that were available during my care of the patient were reviewed by me and considered in my medical decision making.  Assessment & Plan:  Namir was seen today for home covid test positive.  Diagnoses and all orders for this visit:  Positive self-administered antigen test for COVID-19 Will send in medication as below for patient as paxlovid has interaction to lipitor and tamsulosin. Encouraged patient to continue other supportive measures at home. Discussed return precautions.  -     molnupiravir EUA (LAGEVRIO) 200 mg CAPS capsule; Take 4 capsules (800 mg total) by mouth 2 (two) times daily for 5 days.  Continue all other maintenance medications.  Follow up plan: Return if symptoms worsen or fail to improve.   Continue healthy lifestyle choices, including diet (rich in fruits, vegetables, and lean proteins, and low in salt and simple carbohydrates) and exercise (at least 30 minutes of moderate physical activity daily).  Written and verbal instructions provided   The above assessment and management plan was discussed with the patient. The patient verbalized understanding of and has agreed to the management plan. Patient is aware to call the clinic if they develop any new symptoms or if symptoms persist or worsen. Patient is aware when to return to the clinic for a follow-up visit. Patient educated on when it is appropriate to go to the emergency department.   Neale Burly, DNP-FNP [facility] [address] [place], [place] [address] [phone]

## 2023-09-17 ENCOUNTER — Ambulatory Visit: Payer: Medicare Other

## 2023-09-17 VITALS — Ht 66.0 in | Wt 160.0 lb

## 2023-09-17 DIAGNOSIS — Z0000 Encounter for general adult medical examination without abnormal findings: Secondary | ICD-10-CM | POA: Diagnosis not present

## 2023-09-17 NOTE — Patient Instructions (Signed)
Raymond Morales , Thank you for taking time to come for your Medicare Wellness Visit. I appreciate your ongoing commitment to your health goals. Please review the following plan we discussed and let me know if I can assist you in the future.   Referrals/Orders/Follow-Ups/Clinician Recommendations: Aim for 30 minutes of exercise or brisk walking, 6-8 glasses of water, and 5 servings of fruits and vegetables each day.  This is a list of the screening recommended for you and due dates:  Health Maintenance  Topic Date Due   COVID-19 Vaccine (4 - 2024-25 season) 04/26/2023   Medicare Annual Wellness Visit  09/16/2024   Pneumonia Vaccine  Completed   Flu Shot  Completed   Hepatitis C Screening  Completed   Zoster (Shingles) Vaccine  Completed   HPV Vaccine  Aged Out   DTaP/Tdap/Td vaccine  Discontinued    Advanced directives: (ACP Link)Information on Advanced Care Planning can be found at [facility] of [place] Advance Health Care Directives Advance Health Care Directives ([email or web address]   Next Medicare Annual Wellness Visit scheduled for next year: Yes

## 2023-09-17 NOTE — Progress Notes (Signed)
Subjective:   Raymond Morales is a 78 y.o. male who presents for Medicare Annual/Subsequent preventive examination.  Visit Complete: Virtual I connected with  Raymond Morales on 09/17/23 by a audio enabled telemedicine application and verified that I am speaking with the correct person using two identifiers.  Patient Location: Home  Provider Location: Home Office  This patient declined Interactive audio and video telecommunications. Therefore the visit was completed with audio only.  I discussed the limitations of evaluation and management by telemedicine. The patient expressed understanding and agreed to proceed.  Vital Signs: Because this visit was a virtual/telehealth visit, some criteria may be missing or patient reported. Any vitals not documented were not able to be obtained and vitals that have been documented are patient reported.  Cardiac Risk Factors include: advanced age (>89men, >39 women);male gender;dyslipidemia     Objective:    Today's Vitals   09/17/23 1148  Weight: 160 lb (72.6 kg)  Height: 5\' 6"  (1.676 m)   Body mass index is 25.82 kg/m.     09/17/2023   11:51 AM 09/15/2022    9:14 AM 09/11/2021    9:12 AM 12/04/2020    9:07 AM 11/25/2016   11:31 AM  Advanced Directives  Does Patient Have a Medical Advance Directive? No No No No No  Would patient like information on creating a medical advance directive? Yes (MAU/Ambulatory/Procedural Areas - Information given) No - Patient declined No - Patient declined No - Patient declined Yes (MAU/Ambulatory/Procedural Areas - Information given)    Current Medications (verified) Outpatient Encounter Medications as of 09/17/2023  Medication Sig   atorvastatin (LIPITOR) 10 MG tablet Take 1 tablet (10 mg total) by mouth daily.   azelastine (ASTELIN) 0.1 % nasal spray Place 1 spray into both nostrils 2 (two) times daily.   celecoxib (CELEBREX) 100 MG capsule Take 1 capsule (100 mg total) by mouth daily as needed for moderate pain  (take with food).   finasteride (PROSCAR) 5 MG tablet Take 1 tablet (5 mg total) by mouth daily.   loratadine (CLARITIN) 10 MG tablet Take 10 mg by mouth daily.   omeprazole (PRILOSEC) 20 MG capsule Take 1 capsule (20 mg total) by mouth daily.   tamsulosin (FLOMAX) 0.4 MG CAPS capsule Take 1 capsule (0.4 mg total) by mouth daily.   triamcinolone cream (KENALOG) 0.1 % Apply 1 Application topically 2 (two) times daily. x10 days (if needed for allergic rash)   No facility-administered encounter medications on file as of 09/17/2023.    Allergies (verified) Latex   History: Past Medical History:  Diagnosis Date   Allergy    seasonal   BPH (benign prostatic hypertrophy)    Hyperlipidemia    History reviewed. No pertinent surgical history. Family History  Problem Relation Age of Onset   Hypertension Mother    Vision loss Mother    Atrial fibrillation Mother    Heart disease Father    Thyroid disease Father    Hypertension Sister    Atrial fibrillation Sister    Social History   Socioeconomic History   Marital status: Married    Spouse name: Raymond Morales   Number of children: 2   Years of education: Not on file   Highest education level: Bachelor's degree (e.g., BA, AB, BS)  Occupational History   Not on file  Tobacco Use   Smoking status: Never   Smokeless tobacco: Never  Vaping Use   Vaping status: Never Used  Substance and Sexual Activity   Alcohol use:  No   Drug use: No   Sexual activity: Never  Other Topics Concern   Not on file  Social History Narrative   1 daughter and 1 son   Married since 1974. 02/06/73   Social Drivers of Health   Financial Resource Strain: Low Risk  (09/17/2023)   Overall Financial Resource Strain (CARDIA)    Difficulty of Paying Living Expenses: Not hard at all  Food Insecurity: No Food Insecurity (09/17/2023)   Hunger Vital Sign    Worried About Running Out of Food in the Last Year: Never true    Ran Out of Food in the Last Year: Never true   Transportation Needs: No Transportation Needs (09/17/2023)   PRAPARE - Administrator, Civil Service (Medical): No    Lack of Transportation (Non-Medical): No  Physical Activity: Insufficiently Active (09/17/2023)   Exercise Vital Sign    Days of Exercise per Week: 3 days    Minutes of Exercise per Session: 30 min  Stress: No Stress Concern Present (09/17/2023)   [employer] of Occupational Health - Occupational Stress Questionnaire    Feeling of Stress : Not at all  Social Connections: Socially Integrated (09/17/2023)   Social Connection and Isolation Panel [NHANES]    Frequency of Communication with Friends and Family: More than three times a week    Frequency of Social Gatherings with Friends and Family: Three times a week    Attends Religious Services: More than 4 times per year    Active Member of Clubs or Organizations: Yes    Attends Engineer, structural: More than 4 times per year    Marital Status: Married    Tobacco Counseling Counseling given: Not Answered   Clinical Intake:  Pre-visit preparation completed: Yes  Pain : No/denies pain     Diabetes: No  How often do you need to have someone help you when you read instructions, pamphlets, or other written materials from your doctor or pharmacy?: 1 - Never  Interpreter Needed?: No  Information entered by :: Kandis Fantasia LPN   Activities of Daily Living    09/17/2023   11:48 AM  In your present state of health, do you have any difficulty performing the following activities:  Hearing? 0  Vision? 0  Difficulty concentrating or making decisions? 0  Walking or climbing stairs? 0  Dressing or bathing? 0  Doing errands, shopping? 0  Preparing Food and eating ? N  Using the Toilet? N  In the past six months, have you accidently leaked urine? N  Do you have problems with loss of bowel control? N  Managing your Medications? N  Managing your Finances? N  Housekeeping or managing your  Housekeeping? N    Patient Care Team: Raliegh Ip, DO as PCP - General (Family Medicine)  Indicate any recent Medical Services you may have received from other than [facility] providers in the past year (date may be approximate).     Assessment:   This is a routine wellness examination for Raymond Morales.  Hearing/Vision screen Hearing Screening - Comments:: Denies hearing difficulties   Vision Screening - Comments:: Wears rx glasses - up to date with routine eye exams with Dr. Conni Elliot [place], [place])     Goals Addressed             This Visit's Progress    Remain active and independent        Depression Screen    09/17/2023   11:50 AM 08/11/2023  1:49 PM 05/22/2023    8:05 AM 11/19/2022    7:59 AM 09/15/2022    9:13 AM 05/21/2022    8:13 AM 03/20/2022    2:09 PM  PHQ 2/9 Scores  PHQ - 2 Score 0  0 0 0 0 0  PHQ- 9 Score   0 0   0  Exception Documentation  Patient refusal         Fall Risk    09/17/2023   11:51 AM 05/22/2023    8:02 AM 11/19/2022    8:00 AM 09/15/2022    9:12 AM 05/21/2022    8:13 AM  Fall Risk   Falls in the past year? 0 0 0 0 1  Number falls in past yr: 0 0 0 0 0  Injury with Fall? 0 0 0 0 0  Risk for fall due to : No Fall Risks No Fall Risks No Fall Risks No Fall Risks   Follow up Falls prevention discussed;Education provided;Falls evaluation completed Education provided Education provided Falls prevention discussed     MEDICARE RISK AT HOME: Medicare Risk at Home Any stairs in or around the home?: No If so, are there any without handrails?: No Home free of loose throw rugs in walkways, pet beds, electrical cords, etc?: Yes Adequate lighting in your home to reduce risk of falls?: Yes Life alert?: No Use of a cane, walker or w/c?: No Grab bars in the bathroom?: Yes Shower chair or bench in shower?: No Elevated toilet seat or a handicapped toilet?: Yes  TIMED UP AND GO:  Was the test performed?  No    Cognitive Function:    11/25/2016    8:43 AM   MMSE - Mini Mental State Exam  Orientation to time 5  Orientation to Place 5  Registration 3  Attention/ Calculation 5  Recall 2  Language- name 2 objects 2  Language- repeat 1  Language- follow 3 step command 3  Language- read & follow direction 1  Write a sentence 1  Copy design 1  Total score 29        09/17/2023   11:51 AM 09/15/2022    9:15 AM 09/11/2021    9:16 AM  6CIT Screen  What Year? 0 points 0 points 0 points  What month? 0 points 0 points 0 points  What time? 0 points 0 points 0 points  Count back from 20 0 points 0 points 0 points  Months in reverse 0 points 0 points 0 points  Repeat phrase 0 points 0 points 0 points  Total Score 0 points 0 points 0 points    Immunizations Immunization History  Administered Date(s) Administered   Fluad Quad(high Dose 65+) 05/25/2019, 05/23/2020, 05/24/2021, 05/21/2022   Fluad Trivalent(High Dose 65+) 05/25/2023   Influenza, High Dose Seasonal PF 05/27/2017, 05/21/2018, 06/01/2018   Influenza,inj,Quad PF,6+ Mos 05/12/2016   Influenza-Unspecified 06/13/2013, 06/26/2014, 05/28/2015   Moderna SARS-COV2 Booster Vaccination 07/11/2020, 07/25/2022   Moderna Sars-Covid-2 Vaccination 09/18/2019, 10/19/2019, 07/24/2021   Pneumococcal Conjugate-13 08/04/2014   Pneumococcal Polysaccharide-23 08/06/2015   Zoster Recombinant(Shingrix) 11/20/2021, 03/05/2022   Zoster, Live 08/02/2013    TDAP status: Due, Education has been provided regarding the importance of this vaccine. Advised may receive this vaccine at local pharmacy or Health Dept. Aware to provide a copy of the vaccination record if obtained from local pharmacy or Health Dept. Verbalized acceptance and understanding.  Flu Vaccine status: Up to date  Pneumococcal vaccine status: Up to date  Covid-19 vaccine status: Information  provided on how to obtain vaccines.   Qualifies for Shingles Vaccine? Yes   Zostavax completed Yes   Shingrix Completed?: Yes  Screening  Tests Health Maintenance  Topic Date Due   COVID-19 Vaccine (4 - 2024-25 season) 04/26/2023   Medicare Annual Wellness (AWV)  09/16/2024   Pneumonia Vaccine 58+ Years old  Completed   INFLUENZA VACCINE  Completed   Hepatitis C Screening  Completed   Zoster Vaccines- Shingrix  Completed   HPV VACCINES  Aged Out   DTaP/Tdap/Td  Discontinued    Health Maintenance  Health Maintenance Due  Topic Date Due   COVID-19 Vaccine (4 - 2024-25 season) 04/26/2023    Colorectal cancer screening: No longer required.   Lung Cancer Screening: (Low Dose CT Chest recommended if Age 72-80 years, 20 pack-year currently smoking OR have quit w/in 15years.) does not qualify.   Lung Cancer Screening Referral: n/a  Additional Screening:  Hepatitis C Screening: does qualify; Completed 08/06/15  Vision Screening: Recommended annual ophthalmology exams for early detection of glaucoma and other disorders of the eye. Is the patient up to date with their annual eye exam?  Yes  Who is the provider or what is the name of the office in which the patient attends annual eye exams? Dr. Conni Elliot  If pt is not established with a provider, would they like to be referred to a provider to establish care? No .   Dental Screening: Recommended annual dental exams for proper oral hygiene  Community Resource Referral / Chronic Care Management: CRR required this visit?  No   CCM required this visit?  No     Plan:     I have personally reviewed and noted the following in the patient's chart:   Medical and social history Use of alcohol, tobacco or illicit drugs  Current medications and supplements including opioid prescriptions. Patient is not currently taking opioid prescriptions. Functional ability and status Nutritional status Physical activity Advanced directives List of other physicians Hospitalizations, surgeries, and ER visits in previous 12 months Vitals Screenings to include cognitive, depression, and  falls Referrals and appointments  In addition, I have reviewed and discussed with patient certain preventive protocols, quality metrics, and best practice recommendations. A written personalized care plan for preventive services as well as general preventive health recommendations were provided to patient.     Kandis Fantasia [place], [place]   9/62/[id number]   After Visit Summary: (MyChart) Due to this being a telephonic visit, the after visit summary with patients personalized plan was offered to patient via MyChart   Nurse Notes: No concerns at this time

## 2023-10-31 ENCOUNTER — Other Ambulatory Visit: Payer: Self-pay | Admitting: Medical Genetics

## 2023-11-18 ENCOUNTER — Encounter: Payer: Self-pay | Admitting: Family Medicine

## 2023-11-18 ENCOUNTER — Ambulatory Visit (INDEPENDENT_AMBULATORY_CARE_PROVIDER_SITE_OTHER): Payer: Medicare Other | Admitting: Family Medicine

## 2023-11-18 VITALS — BP 148/88 | HR 81 | Temp 98.4°F | Ht 66.0 in | Wt 163.2 lb

## 2023-11-18 DIAGNOSIS — G479 Sleep disorder, unspecified: Secondary | ICD-10-CM

## 2023-11-18 DIAGNOSIS — R451 Restlessness and agitation: Secondary | ICD-10-CM

## 2023-11-18 DIAGNOSIS — H6992 Unspecified Eustachian tube disorder, left ear: Secondary | ICD-10-CM

## 2023-11-18 DIAGNOSIS — M25511 Pain in right shoulder: Secondary | ICD-10-CM

## 2023-11-18 DIAGNOSIS — G8929 Other chronic pain: Secondary | ICD-10-CM | POA: Diagnosis not present

## 2023-11-18 DIAGNOSIS — F419 Anxiety disorder, unspecified: Secondary | ICD-10-CM | POA: Diagnosis not present

## 2023-11-18 MED ORDER — FLUTICASONE PROPIONATE 50 MCG/ACT NA SUSP: 2.0000 | Freq: Every day | NASAL | 6 refills | Status: DC

## 2023-11-18 MED ORDER — MIRTAZAPINE 7.5 MG PO TABS
7.5000 mg | ORAL_TABLET | Freq: Every day | ORAL | 0 refills | Status: DC
Start: 2023-11-18 — End: 2024-05-23

## 2023-11-18 NOTE — Progress Notes (Signed)
 Subjective: CC: Multiple concerns today PCP: Raliegh Ip, DO [id number] Raymond Morales is a 78 y.o. male presenting to clinic today for:  1.  Sleep difficulty He reports sometimes he has difficulty falling asleep but most the time he has difficulty staying asleep.  He can fall asleep and then not even 30 minutes later wake right back up.  He does not report that any urinary symptoms are causing his sleep difficulty.  He does have some restless leg but reports that this is fairly mild and is not usually bothersome.  He has been having what he feels like is maybe some more anxiety during the daytime and this occurs sporadically where he feels like he just has to get up and move around.  He reports no gait abnormalities.  He has had no substantial changes in diet or activity.  Continues to work 20 hours/week  2.  Right shoulder pain He reports that he has been having some right anterior shoulder pain that started in the fall.  He was just washing dishes when it suddenly started and caused severe pain.  He has had issues with his shoulders in the past.  He has been in physical therapy and has been trying to do some of these home physical therapy exercises.  In fact he reports that he lost active range of motion for a while and even after some repetitive movement now is not able to fully AB duct the arm.  He has been utilizing the lidocaine patches to the affected area and of course his daily Celebrex as needed.  Reports no neuropathic symptoms.  3.  Ear pain Patient reports some ear fullness and discomfort on the left side.  He had some teeth that were worked on earlier this year which did relieve 85% of the pain but over the last several days he started noticing some fullness on that side again.  He is compliant with Claritin and Astelin.  He reports no drainage or purulence from the nares.  No fevers.  He does not grind his teeth at nighttime.  He does have to sleep on the left because the right  shoulder causes discomfort when he sleeps on that side   ROS: Per HPI  Allergies  Allergen Reactions   Latex     Other reaction(s): Unknown   Past Medical History:  Diagnosis Date   Allergy    seasonal   BPH (benign prostatic hypertrophy)    Hyperlipidemia     Current Outpatient Medications:    atorvastatin (LIPITOR) 10 MG tablet, Take 1 tablet (10 mg total) by mouth daily., Disp: 100 tablet, Rfl: 3   azelastine (ASTELIN) 0.1 % nasal spray, Place 1 spray into both nostrils 2 (two) times daily., Disp: 30 mL, Rfl: prn   celecoxib (CELEBREX) 100 MG capsule, Take 1 capsule (100 mg total) by mouth daily as needed for moderate pain (take with food)., Disp: 100 capsule, Rfl: 3   finasteride (PROSCAR) 5 MG tablet, Take 1 tablet (5 mg total) by mouth daily., Disp: 100 tablet, Rfl: 3   loratadine (CLARITIN) 10 MG tablet, Take 10 mg by mouth daily., Disp: , Rfl:    omeprazole (PRILOSEC) 20 MG capsule, Take 1 capsule (20 mg total) by mouth daily., Disp: 100 capsule, Rfl: 3   tamsulosin (FLOMAX) 0.4 MG CAPS capsule, Take 1 capsule (0.4 mg total) by mouth daily., Disp: 100 capsule, Rfl: 3   triamcinolone cream (KENALOG) 0.1 %, Apply 1 Application topically 2 (two) times daily. x10  days (if needed for allergic rash), Disp: 45 g, Rfl: 1 Social History   Socioeconomic History   Marital status: Married    Spouse name: Harriett Sine   Number of children: 2   Years of education: Not on file   Highest education level: Bachelor's degree (e.g., BA, AB, BS)  Occupational History   Not on file  Tobacco Use   Smoking status: Never   Smokeless tobacco: Never  Vaping Use   Vaping status: Never Used  Substance and Sexual Activity   Alcohol use: No   Drug use: No   Sexual activity: Never  Other Topics Concern   Not on file  Social History Narrative   1 daughter and 1 son   Married since 1974. 02/06/73   Social Drivers of Health   Financial Resource Strain: Low Risk  (09/17/2023)   Overall Financial  Resource Strain (CARDIA)    Difficulty of Paying Living Expenses: Not hard at all  Food Insecurity: No Food Insecurity (09/17/2023)   Hunger Vital Sign    Worried About Running Out of Food in the Last Year: Never true    Ran Out of Food in the Last Year: Never true  Transportation Needs: No Transportation Needs (09/17/2023)   PRAPARE - Administrator, Civil Service (Medical): No    Lack of Transportation (Non-Medical): No  Physical Activity: Insufficiently Active (09/17/2023)   Exercise Vital Sign    Days of Exercise per Week: 3 days    Minutes of Exercise per Session: 30 min  Stress: No Stress Concern Present (09/17/2023)   [employer] of Occupational Health - Occupational Stress Questionnaire    Feeling of Stress : Not at all  Social Connections: Socially Integrated (09/17/2023)   Social Connection and Isolation Panel [NHANES]    Frequency of Communication with Friends and Family: More than three times a week    Frequency of Social Gatherings with Friends and Family: Three times a week    Attends Religious Services: More than 4 times per year    Active Member of Clubs or Organizations: Yes    Attends Banker Meetings: More than 4 times per year    Marital Status: Married  Catering manager Violence: Not At Risk (09/17/2023)   Humiliation, Afraid, Rape, and Kick questionnaire    Fear of Current or Ex-Partner: No    Emotionally Abused: No    Physically Abused: No    Sexually Abused: No   Family History  Problem Relation Age of Onset   Hypertension Mother    Vision loss Mother    Atrial fibrillation Mother    Heart disease Father    Thyroid disease Father    Hypertension Sister    Atrial fibrillation Sister     Objective: Office vital signs reviewed. BP (!) 148/88   Pulse 81   Temp 98.4 F (36.9 C)   Ht 5\' 6"  (1.676 m)   Wt 163 lb 3.2 oz (74 kg)   SpO2 98%   BMI 26.34 kg/m   Physical Examination:  General: Awake, alert, well nourished, No  acute distress HEENT: TMs intact bilaterally with bulging TM on left with clear effusion behind it.  No appreciable erythema or exudates within the external auditory canal.  He does have cobblestone appearance of the oropharynx Cardio: regular rate and rhythm  Pulm:  normal work of breathing on room air MSK: Normal gait and hunched station.  Painful arc sign present on the right but he is able to  fully AB duct the shoulder.  He has a very limited internal rotation.  Positive Hawkins sign.  Positive pain with full can maneuver with associated mild weakness.  He has tenderness to palpation along the anterior shoulder at the before meals and lateral to the [facility].  No palpable step-offs or deformity.     11/18/2023    8:20 AM 09/17/2023   11:50 AM 05/22/2023    8:05 AM  Depression screen PHQ 2/9  Decreased Interest 0 0 0  Down, Depressed, Hopeless 0 0 0  PHQ - 2 Score 0 0 0  Altered sleeping 3  0  Tired, decreased energy 2  0  Change in appetite 0  0  Feeling bad or failure about yourself  0  0  Trouble concentrating 0  0  Moving slowly or fidgety/restless 0  0  Suicidal thoughts 0  0  PHQ-9 Score 5  0  Difficult doing work/chores Somewhat difficult  Not difficult at all      11/18/2023    8:20 AM 05/22/2023    8:05 AM 11/19/2022    7:59 AM 05/21/2022    8:13 AM  GAD 7 : Generalized Anxiety Score  Nervous, Anxious, on Edge 2 0 0 0  Control/stop worrying 0 0 0 0  Worry too much - different things  0 0 0  Trouble relaxing 2 0 0 0  Restless 2 0 0 0  Easily annoyed or irritable 0 0 0 0  Afraid - awful might happen 0 0 0 0  Total GAD 7 Score  0 0 0  Anxiety Difficulty Somewhat difficult  Not difficult at all Not difficult at all   Assessment/ Plan: 79 y.o. male   Sleep disturbances - Plan: mirtazapine (REMERON) 7.5 MG tablet  Anxiety - Plan: mirtazapine (REMERON) 7.5 MG tablet  Motor restlessness - Plan: mirtazapine (REMERON) 7.5 MG tablet  Chronic right shoulder pain - Plan:  Ambulatory referral to Orthopedic Surgery  Eustachian tube dysfunction, left - Plan: fluticasone (FLONASE) 50 MCG/ACT nasal spray  Will trial mirtazapine given reports of anxiety and restlessness.  We certainly could consider adding something for restless leg syndrome.  May consider obtaining an iron level as well at some point.  I will plan to see him back in about 6 to 8 weeks for revisiting of his symptomology.  Offered referral to formal physical therapy versus orthopedics and he would like to proceed with orthopedics given failure of home physical therapy and conservative treatment.  We consider corticosteroid injection but I wish to defer this to orthopedics to ensure that we are treating him appropriately  Clinically appears to have eustachian tube dysfunction.  I am adding Flonase.  We discussed the importance of compliance with antihistamines to reduce risk of complications including infection.   Raliegh Ip, DO [facility] [phone]

## 2023-11-25 ENCOUNTER — Other Ambulatory Visit (INDEPENDENT_AMBULATORY_CARE_PROVIDER_SITE_OTHER): Payer: Self-pay

## 2023-11-25 ENCOUNTER — Encounter: Payer: Self-pay | Admitting: Orthopedic Surgery

## 2023-11-25 ENCOUNTER — Ambulatory Visit: Admitting: Orthopedic Surgery

## 2023-11-25 VITALS — BP 169/80 | HR 80 | Wt 162.4 lb

## 2023-11-25 DIAGNOSIS — G8929 Other chronic pain: Secondary | ICD-10-CM

## 2023-11-25 DIAGNOSIS — M25511 Pain in right shoulder: Secondary | ICD-10-CM

## 2023-11-25 NOTE — Progress Notes (Deleted)
 New Patient Visit  Assessment: Raymond Morales is a 78 y.o. male with the following: 1. Chronic right shoulder pain ***   Plan: Raymond Morales   Follow-up: No follow-ups on file.  Subjective:  Chief Complaint  Patient presents with   Shoulder Pain    Right shoulder pain- injured rotater cuff since November from washing dishes and reached and felt pain    History of Present Illness: Raymond Morales is a 78 y.o. male who {Presentation:27320} for evaluation of    Review of Systems: No fevers or chills*** No numbness or tingling No chest pain No shortness of breath No bowel or bladder dysfunction No GI distress No headaches   Medical History:  Past Medical History:  Diagnosis Date   Allergy    seasonal   BPH (benign prostatic hypertrophy)    Hyperlipidemia     No past surgical history on file.  Family History  Problem Relation Age of Onset   Hypertension Mother    Vision loss Mother    Atrial fibrillation Mother    Heart disease Father    Thyroid disease Father    Hypertension Sister    Atrial fibrillation Sister    Social History   Tobacco Use   Smoking status: Never   Smokeless tobacco: Never  Vaping Use   Vaping status: Never Used  Substance Use Topics   Alcohol use: No   Drug use: No    Allergies  Allergen Reactions   Latex     Other reaction(s): Unknown    No outpatient medications have been marked as taking for the 11/25/23 encounter (Office Visit) with Oliver Barre, MD.    Objective: BP (!) 169/80   Pulse 80   Wt 162 lb 6.4 oz (73.7 kg)   BMI 26.21 kg/m   Physical Exam:  General: {General PE Findings:25791} Gait: {Gait:25792}    IMAGING: {XR Reviewed:24899}   New Medications:  No orders of the defined types were placed in this encounter.     Oliver Barre, MD  11/25/2023 3:42 PM

## 2023-11-25 NOTE — Patient Instructions (Signed)

## 2023-11-25 NOTE — Progress Notes (Signed)
 New Patient Visit  Assessment: Raymond Morales is a 78 y.o. male with the following: 1. Chronic right shoulder pain  Plan: Raymond Morales has chronic pain in his right shoulder.  Recent onset within the last few months.  We reviewed radiographs in clinic today which demonstrate some proximal humeral migration, consistent with a chronic rotator cuff injury.  He is complaining of a lot of pain, he with some difficulty with overhead motion.  He is interested in a steroid injection.  This was completed in clinic today.  He will follow-up as needed.  Procedure note injection - Right shoulder    Verbal consent was obtained to inject the right shoulder, subacromial space Timeout was completed to confirm the site of injection.   The skin was prepped with alcohol and ethyl chloride was sprayed at the injection site.  A 21-gauge needle was used to inject 40 mg of Depo-Medrol and 1% lidocaine (4 cc) into the subacromial space of the right shoulder using a posterolateral approach.  There were no complications.  A sterile bandage was applied.    Follow-up: Return if symptoms worsen or fail to improve.  Subjective:  Chief Complaint  Patient presents with   Shoulder Pain    Right shoulder pain- injured rotater cuff since November from washing dishes and reached and felt pain    History of Present Illness: Raymond Morales is a 78 y.o. male who has been referred by  Delynn Flavin, DO for evaluation of shoulder pain.  He is right-hand dominant.  He states he has had progressively worsening pain in his right shoulder for the past several months.  Prior to that, he may have injured his shoulder several years ago.  More recently, he was washing some dishes, we noted a lot of pain and difficulty holding the dish.  He has tried some medication.  No prior injections.  He states his pain gets a lot worse at nighttime.  The pain is keeping him up.  Sometimes, he struggles to get his arm above the level of his  shoulder.   Review of Systems: No fevers or chills No numbness or tingling No chest pain No shortness of breath No bowel or bladder dysfunction No GI distress No headaches   Medical History:  Past Medical History:  Diagnosis Date   Allergy    seasonal   BPH (benign prostatic hypertrophy)    Hyperlipidemia     No past surgical history on file.  Family History  Problem Relation Age of Onset   Hypertension Mother    Vision loss Mother    Atrial fibrillation Mother    Heart disease Father    Thyroid disease Father    Hypertension Sister    Atrial fibrillation Sister    Social History   Tobacco Use   Smoking status: Never   Smokeless tobacco: Never  Vaping Use   Vaping status: Never Used  Substance Use Topics   Alcohol use: No   Drug use: No    Allergies  Allergen Reactions   Latex     Other reaction(s): Unknown    Current Meds  Medication Sig   atorvastatin (LIPITOR) 10 MG tablet Take 1 tablet (10 mg total) by mouth daily.   azelastine (ASTELIN) 0.1 % nasal spray Place 1 spray into both nostrils 2 (two) times daily.   celecoxib (CELEBREX) 100 MG capsule Take 1 capsule (100 mg total) by mouth daily as needed for moderate pain (take with food).   finasteride (PROSCAR) 5 MG  tablet Take 1 tablet (5 mg total) by mouth daily.   fluticasone (FLONASE) 50 MCG/ACT nasal spray Place 2 sprays into both nostrils daily.   loratadine (CLARITIN) 10 MG tablet Take 10 mg by mouth daily.   mirtazapine (REMERON) 7.5 MG tablet Take 1 tablet (7.5 mg total) by mouth at bedtime.   omeprazole (PRILOSEC) 20 MG capsule Take 1 capsule (20 mg total) by mouth daily.   tamsulosin (FLOMAX) 0.4 MG CAPS capsule Take 1 capsule (0.4 mg total) by mouth daily.   triamcinolone cream (KENALOG) 0.1 % Apply 1 Application topically 2 (two) times daily. x10 days (if needed for allergic rash)    Objective: BP (!) 169/80   Pulse 80   Wt 162 lb 6.4 oz (73.7 kg)   BMI 26.21 kg/m   Physical  Exam:  General: Alert and oriented. and No acute distress. Gait: Normal gait.  Right shoulder without deformity.  No atrophy.  No redness.  Point tenderness over the anterior lateral aspect of the shoulder.  160 degrees of forward flexion, with some difficulty.  Internal rotation to his back pocket.  Weakness in supraspinatus and infraspinatus testing.  Some pain with subscapularis testing.  Positive Jobe's.  IMAGING: I personally ordered and reviewed the following images  X-rays of the right shoulder were obtained in clinic today.  No acute injuries are noted.  Minimal degenerative changes.  There is proximal humeral migration, where the humerus is almost abutting the undersurface of the acromion.  No bony lesions.  Impression: Right shoulder x-rays with proximal humeral migration, consistent with chronic rotator cuff tear.   New Medications:  No orders of the defined types were placed in this encounter.     Oliver Barre, MD  11/25/2023 3:45 PM

## 2024-01-06 ENCOUNTER — Ambulatory Visit: Admitting: Family Medicine

## 2024-03-09 ENCOUNTER — Other Ambulatory Visit (HOSPITAL_COMMUNITY)
Admission: RE | Admit: 2024-03-09 | Discharge: 2024-03-09 | Disposition: A | Discharge status: Home / Self Care | Payer: Self-pay | Source: Ambulatory Visit | Attending: Oncology | Admitting: Oncology

## 2024-03-21 LAB — GENECONNECT MOLECULAR SCREEN: Genetic Analysis Overall Interpretation: NEGATIVE

## 2024-03-31 DIAGNOSIS — H5213 Myopia, bilateral: Secondary | ICD-10-CM | POA: Diagnosis not present

## 2024-03-31 DIAGNOSIS — H524 Presbyopia: Secondary | ICD-10-CM | POA: Diagnosis not present

## 2024-03-31 DIAGNOSIS — H52203 Unspecified astigmatism, bilateral: Secondary | ICD-10-CM | POA: Diagnosis not present

## 2024-03-31 DIAGNOSIS — H25093 Other age-related incipient cataract, bilateral: Secondary | ICD-10-CM | POA: Diagnosis not present

## 2024-03-31 DIAGNOSIS — H538 Other visual disturbances: Secondary | ICD-10-CM | POA: Diagnosis not present

## 2024-03-31 DIAGNOSIS — H18521 Epithelial (juvenile) corneal dystrophy, right eye: Secondary | ICD-10-CM | POA: Diagnosis not present

## 2024-05-23 ENCOUNTER — Ambulatory Visit: Payer: Medicare Other | Admitting: Family Medicine

## 2024-05-23 ENCOUNTER — Encounter: Payer: Self-pay | Admitting: Family Medicine

## 2024-05-23 VITALS — BP 128/70 | HR 77 | Temp 97.4°F | Ht 64.25 in | Wt 156.8 lb

## 2024-05-23 DIAGNOSIS — L570 Actinic keratosis: Secondary | ICD-10-CM | POA: Diagnosis not present

## 2024-05-23 DIAGNOSIS — K219 Gastro-esophageal reflux disease without esophagitis: Secondary | ICD-10-CM | POA: Diagnosis not present

## 2024-05-23 DIAGNOSIS — M25562 Pain in left knee: Secondary | ICD-10-CM

## 2024-05-23 DIAGNOSIS — G8929 Other chronic pain: Secondary | ICD-10-CM

## 2024-05-23 DIAGNOSIS — E782 Mixed hyperlipidemia: Secondary | ICD-10-CM

## 2024-05-23 DIAGNOSIS — R6889 Other general symptoms and signs: Secondary | ICD-10-CM | POA: Diagnosis not present

## 2024-05-23 DIAGNOSIS — H6992 Unspecified Eustachian tube disorder, left ear: Secondary | ICD-10-CM | POA: Diagnosis not present

## 2024-05-23 DIAGNOSIS — R451 Restlessness and agitation: Secondary | ICD-10-CM | POA: Diagnosis not present

## 2024-05-23 DIAGNOSIS — Z91038 Other insect allergy status: Secondary | ICD-10-CM

## 2024-05-23 DIAGNOSIS — F419 Anxiety disorder, unspecified: Secondary | ICD-10-CM | POA: Diagnosis not present

## 2024-05-23 DIAGNOSIS — E559 Vitamin D deficiency, unspecified: Secondary | ICD-10-CM | POA: Diagnosis not present

## 2024-05-23 DIAGNOSIS — G5793 Unspecified mononeuropathy of bilateral lower limbs: Secondary | ICD-10-CM | POA: Diagnosis not present

## 2024-05-23 DIAGNOSIS — G479 Sleep disorder, unspecified: Secondary | ICD-10-CM | POA: Diagnosis not present

## 2024-05-23 DIAGNOSIS — Z23 Encounter for immunization: Secondary | ICD-10-CM

## 2024-05-23 DIAGNOSIS — N400 Enlarged prostate without lower urinary tract symptoms: Secondary | ICD-10-CM

## 2024-05-23 MED ORDER — TRIAMCINOLONE ACETONIDE 0.1 % EX CREA: 1.0000 | TOPICAL_CREAM | Freq: Two times a day (BID) | CUTANEOUS | 1 refills | Status: AC

## 2024-05-23 MED ORDER — ALPHA-LIPOIC ACID 600 MG PO TABS: 600.0000 mg | ORAL_TABLET | Freq: Every day | ORAL | 3 refills | Status: AC

## 2024-05-23 MED ORDER — FLUOROURACIL 5 % EX CREA: TOPICAL_CREAM | Freq: Two times a day (BID) | CUTANEOUS | 1 refills | Status: AC

## 2024-05-23 MED ORDER — AZELASTINE HCL 0.1 % NA SOLN: 1.0000 | Freq: Two times a day (BID) | NASAL | 99 refills | Status: AC

## 2024-05-23 MED ORDER — OMEPRAZOLE 20 MG PO CPDR: 20.0000 mg | DELAYED_RELEASE_CAPSULE | Freq: Every day | ORAL | 3 refills | Status: DC

## 2024-05-23 MED ORDER — FLUTICASONE PROPIONATE 50 MCG/ACT NA SUSP: 2.0000 | Freq: Every day | NASAL | 6 refills | Status: AC

## 2024-05-23 MED ORDER — MIRTAZAPINE 7.5 MG PO TABS: 7.5000 mg | ORAL_TABLET | Freq: Every day | ORAL | 0 refills | Status: AC

## 2024-05-23 MED ORDER — ATORVASTATIN CALCIUM 10 MG PO TABS: 10.0000 mg | ORAL_TABLET | Freq: Every day | ORAL | 3 refills | Status: DC

## 2024-05-23 MED ORDER — TAMSULOSIN HCL 0.4 MG PO CAPS: 0.4000 mg | ORAL_CAPSULE | Freq: Every day | ORAL | 3 refills | Status: DC

## 2024-05-23 MED ORDER — CELECOXIB 100 MG PO CAPS: 100.0000 mg | ORAL_CAPSULE | Freq: Every day | ORAL | 3 refills | Status: AC | PRN

## 2024-05-23 MED ORDER — FINASTERIDE 5 MG PO TABS: 5.0000 mg | ORAL_TABLET | Freq: Every day | ORAL | 3 refills | Status: DC

## 2024-05-23 NOTE — Progress Notes (Signed)
 Raymond Morales is a 78 y.o. male presents to office today for annual physical exam examination.    Overall he reports that he has been doing well.  Reports improvement in right shoulder after shot with orthopedics.  He reports that mirtazapine  seems to be working well as a as needed medication for him but sometimes can lead to some excessive daytime sedation so his wife will cut it in half for him.  He has several spots on his scalp and arms that he would like me to take a look at as he thinks he needs some cryoablation today.  He reports some numbness on bilateral plantar aspects of the feet.  Denies any overt tingling or pins and needle sensation.  When he actually touches his feet he can feel them.  No history of diabetes.  He eats meat.  Denies any bleeding.  Elevation seems to make this a little bit better but it comes back at the end of the day.   Occupation: retired, Marital status: Married, Substance use: none Health Maintenance Due  Topic Date Due   Influenza Vaccine  03/25/2024    Immunization History  Administered Date(s) Administered   Fluad Quad(high Dose 65+) 05/25/2019, 05/23/2020, 05/24/2021, 05/21/2022   Fluad Trivalent(High Dose 65+) 05/25/2023   INFLUENZA, HIGH DOSE SEASONAL PF 05/27/2017, 05/21/2018, 06/01/2018   Influenza,inj,Quad PF,6+ Mos 05/12/2016   Influenza-Unspecified 06/13/2013, 06/26/2014, 05/28/2015   Moderna SARS-COV2 Booster Vaccination 07/11/2020, 07/25/2022   Moderna Sars-Covid-2 Vaccination 09/18/2019, 10/19/2019, 07/24/2021   Pneumococcal Conjugate-13 08/04/2014   Pneumococcal Polysaccharide-23 08/06/2015   Zoster Recombinant(Shingrix ) 11/20/2021, 03/05/2022   Zoster, Live 08/02/2013   Past Medical History:  Diagnosis Date   Allergy    seasonal   BPH (benign prostatic hypertrophy)    Hyperlipidemia    Social History   Socioeconomic History   Marital status: Married    Spouse name: Inocente   Number of children: 2   Years of education: Not on  file   Highest education level: Bachelor's degree (e.g., BA, AB, BS)  Occupational History   Not on file  Tobacco Use   Smoking status: Never   Smokeless tobacco: Never  Vaping Use   Vaping status: Never Used  Substance and Sexual Activity   Alcohol use: No   Drug use: No   Sexual activity: Never  Other Topics Concern   Not on file  Social History Narrative   1 daughter and 1 son   Married since 1974. 02/06/73   Social Drivers of Health   Financial Resource Strain: Low Risk  (05/19/2024)   Overall Financial Resource Strain (CARDIA)    Difficulty of Paying Living Expenses: Not hard at all  Food Insecurity: No Food Insecurity (05/19/2024)   Hunger Vital Sign    Worried About Running Out of Food in the Last Year: Never true    Ran Out of Food in the Last Year: Never true  Transportation Needs: No Transportation Needs (05/19/2024)   PRAPARE - Administrator, Civil Service (Medical): No    Lack of Transportation (Non-Medical): No  Physical Activity: Insufficiently Active (09/17/2023)   Exercise Vital Sign    Days of Exercise per Week: 3 days    Minutes of Exercise per Session: 30 min  Stress: No Stress Concern Present (05/19/2024)   [employer] of Occupational Health - Occupational Stress Questionnaire    Feeling of Stress: Only a little  Social Connections: Unknown (05/19/2024)   Social Connection and Isolation Panel    Frequency  of Communication with Friends and Family: More than three times a week    Frequency of Social Gatherings with Friends and Family: More than three times a week    Attends Religious Services: Not on file    Active Member of Clubs or Organizations: Yes    Attends Banker Meetings: More than 4 times per year    Marital Status: Not on file  Intimate Partner Violence: Not At Risk (09/17/2023)   Humiliation, Afraid, Rape, and Kick questionnaire    Fear of Current or Ex-Partner: No    Emotionally Abused: No    Physically Abused:  No    Sexually Abused: No   History reviewed. No pertinent surgical history. Family History  Problem Relation Age of Onset   Hypertension Mother    Vision loss Mother    Atrial fibrillation Mother    Heart disease Father    Thyroid  disease Father    Hypertension Sister    Atrial fibrillation Sister     Current Outpatient Medications:    Alpha-Lipoic Acid 600 MG TABS, Take 600 mg by mouth daily. For neuropathy of feet, Disp: 100 tablet, Rfl: 3   fluorouracil (EFUDEX) 5 % cream, Apply topically 2 (two) times daily. X4 weeks on scalp, Disp: 40 g, Rfl: 1   loratadine (CLARITIN) 10 MG tablet, Take 10 mg by mouth daily., Disp: , Rfl:    atorvastatin  (LIPITOR) 10 MG tablet, Take 1 tablet (10 mg total) by mouth daily., Disp: 100 tablet, Rfl: 3   azelastine  (ASTELIN ) 0.1 % nasal spray, Place 1 spray into both nostrils 2 (two) times daily., Disp: 30 mL, Rfl: prn   celecoxib  (CELEBREX ) 100 MG capsule, Take 1 capsule (100 mg total) by mouth daily as needed for moderate pain (pain score 4-6) (take with food)., Disp: 100 capsule, Rfl: 3   finasteride  (PROSCAR ) 5 MG tablet, Take 1 tablet (5 mg total) by mouth daily., Disp: 100 tablet, Rfl: 3   fluticasone  (FLONASE ) 50 MCG/ACT nasal spray, Place 2 sprays into both nostrils daily., Disp: 16 g, Rfl: 6   mirtazapine  (REMERON ) 7.5 MG tablet, Take 1 tablet (7.5 mg total) by mouth at bedtime., Disp: 90 tablet, Rfl: 0   omeprazole  (PRILOSEC) 20 MG capsule, Take 1 capsule (20 mg total) by mouth daily., Disp: 100 capsule, Rfl: 3   tamsulosin  (FLOMAX ) 0.4 MG CAPS capsule, Take 1 capsule (0.4 mg total) by mouth daily., Disp: 100 capsule, Rfl: 3   triamcinolone  cream (KENALOG ) 0.1 %, Apply 1 Application topically 2 (two) times daily. x10 days (if needed for allergic rash), Disp: 45 g, Rfl: 1  Allergies  Allergen Reactions   Latex     Other reaction(s): Unknown     ROS: Review of Systems Pertinent items noted in HPI and remainder of comprehensive ROS  otherwise negative.    Physical exam BP 128/70   Pulse 77   Temp (!) 97.4 F (36.3 C)   Ht 5' 4.25 (1.632 m)   Wt 156 lb 12.8 oz (71.1 kg)   SpO2 93%   BMI 26.71 kg/m  General appearance: alert, cooperative, appears stated age, and no distress Head: Normocephalic, without obvious abnormality, atraumatic Eyes: negative findings: lids and lashes normal, conjunctivae and sclerae normal, corneas clear, and pupils equal, round, reactive to light and accomodation Ears: normal TM's and external ear canals both ears Nose: Nares normal. Septum midline. Mucosa normal. No drainage or sinus tenderness. Throat: lips, mucosa, and tongue normal; teeth and gums normal Neck: no adenopathy,  no carotid bruit, supple, symmetrical, trachea midline, and thyroid  not enlarged, symmetric, no tenderness/mass/nodules Back: Slight increased kyphosis of thoracic spine Lungs: clear to auscultation bilaterally Chest wall: no tenderness Heart: regular rate and rhythm, S1, S2 normal, no murmur, click, rub or gallop Abdomen: soft, non-tender; bowel sounds normal; no masses,  no organomegaly Extremities: extremities normal, atraumatic, no cyanosis or edema Pulses: 2+ and symmetric Skin: Has several hyperkeratotic flesh-colored lesions along the scalp (5 in total), 2 along the left upper extremity and 1 along the right side of the nasal bridge.  Several seborrheic keratoses. Lymph nodes: Cervical, supraclavicular, and axillary nodes normal. Neurologic: Grossly normal      11/18/2023    8:20 AM 09/17/2023   11:50 AM 05/22/2023    8:05 AM  Depression screen PHQ 2/9  Decreased Interest 0 0 0  Down, Depressed, Hopeless 0 0 0  PHQ - 2 Score 0 0 0  Altered sleeping 3  0  Tired, decreased energy 2  0  Change in appetite 0  0  Feeling bad or failure about yourself  0  0  Trouble concentrating 0  0  Moving slowly or fidgety/restless 0  0  Suicidal thoughts 0  0  PHQ-9 Score 5  0  Difficult doing work/chores Somewhat  difficult  Not difficult at all      11/18/2023    8:20 AM 05/22/2023    8:05 AM 11/19/2022    7:59 AM 05/21/2022    8:13 AM  GAD 7 : Generalized Anxiety Score  Nervous, Anxious, on Edge 2 0 0 0  Control/stop worrying 0 0 0 0  Worry too much - different things  0 0 0  Trouble relaxing 2 0 0 0  Restless 2 0 0 0  Easily annoyed or irritable 0 0 0 0  Afraid - awful might happen 0 0 0 0  Total GAD 7 Score  0 0 0  Anxiety Difficulty Somewhat difficult  Not difficult at all Not difficult at all   Cryotherapy Procedure:  Risks and benefits of procedure were reviewed with the patient.  Written consent obtained and scanned into the chart.  Lesion of concern was identified and located on scalp x5, right nasal bridge x1, LUE x2.  Liquid nitrogen was applied to area of concern and extending out 1 millimeters beyond the border of the lesion.  Treated area was allowed to come back to room temperature before treating it a second time.  Patient tolerated procedure well and there were no immediate complications.  Home care instructions were reviewed with the patient and a handout was provided.    Assessment/ Plan: Salomon Moose here for annual physical exam.   Actinic keratoses - Plan: fluorouracil (EFUDEX) 5 % cream  Neuropathy of both feet - Plan: Alpha-Lipoic Acid 600 MG TABS  Motor restlessness - Plan: CMP14+EGFR, CBC with Differential, Iron, TIBC and Ferritin Panel, Vitamin B12, mirtazapine  (REMERON ) 7.5 MG tablet  Sleep disturbances - Plan: CMP14+EGFR, mirtazapine  (REMERON ) 7.5 MG tablet  Benign prostatic hyperplasia without lower urinary tract symptoms - Plan: PSA, finasteride  (PROSCAR ) 5 MG tablet, tamsulosin  (FLOMAX ) 0.4 MG CAPS capsule  Mixed hyperlipidemia - Plan: CMP14+EGFR, Lipid Panel, TSH, atorvastatin  (LIPITOR) 10 MG tablet  Vitamin D  deficiency - Plan: VITAMIN D  25 Hydroxy (Vit-D Deficiency, Fractures)  Gastroesophageal reflux disease without esophagitis - Plan: CBC with  Differential, omeprazole  (PRILOSEC) 20 MG capsule  Eustachian tube dysfunction, left - Plan: azelastine  (ASTELIN ) 0.1 % nasal spray, fluticasone  (FLONASE ) 50 MCG/ACT nasal  spray  Chronic pain of left knee - Plan: celecoxib  (CELEBREX ) 100 MG capsule  Allergic reaction to insect bite - Plan: triamcinolone  cream (KENALOG ) 0.1 %   Cryoablation for actinic keratoses today.  I am also can give him 5-FU to start.  Discussed how to utilize medication.  Use up to 4 weeks for scalp lesions.  Trial of alpha lipoic acid.  I collected vitamin B12 on him as well today given history of motor restlessness  Doing well with mirtazapine  as needed for sleep  BPH fairly well-controlled with Flomax , Proscar .  Check PSA.  Fasting labs collected along with vitamin D  given history of deficiency.  PPI renewed.  Chronic issues are chronic and stable and medications have been sent accordingly.  Counseled on healthy lifestyle choices, including diet (rich in fruits, vegetables and lean meats and low in salt and simple carbohydrates) and exercise (at least 30 minutes of moderate physical activity daily).  Patient to follow up 82m  Shaune Westfall M. Jolinda, DO

## 2024-05-23 NOTE — Addendum Note (Signed)
 Addended by: SHERRE SUZEN PARAS on: 05/23/2024 11:40 AM   Modules accepted: Orders

## 2024-05-23 NOTE — Patient Instructions (Signed)
 Actinic Keratosis An actinic keratosis is a precancerous growth on the skin. If there is more than one, the condition is called actinic keratoses. These growths appear most often on parts of the skin that get a lot of sun exposure, including the: Scalp. Face. Ears. Lips. Upper back. Forearms. Backs of the hands. If left untreated, these growths may develop into a skin cancer called squamous cell carcinoma. It is important to have all these growths checked by a health care provider to determine the best treatment. What are the causes? Actinic keratoses are caused by getting too much ultraviolet (UV) radiation from the sun or other UV light sources. What increases the risk? You are more likely to develop this condition if you: Have light-colored skin or blue eyes. Have blond or red hair. Spend a lot of time in the sun. Do not protect your skin from the sun when outdoors. Are an older person. The risk of developing an actinic keratosis increases with age. What are the signs or symptoms? These growths feel like scaly, rough spots of skin. Symptoms of this condition include growths that may: Be as small as a pinhead or as big as a quarter. Itch, hurt, or feel sensitive. Be skin-colored, light tan, dark tan, pink, or a combination of these colors. In most cases, the growths become red. Have a small piece of pink or gray skin (skin tag) growing from them. It may be easier to notice the growths by feeling them rather than seeing them. Sometimes, actinic keratoses disappear but may return a few days to a few weeks later. How is this diagnosed? This condition is usually diagnosed with a physical exam. A tissue sample may be removed from the growth and examined under a microscope (biopsy). How is this treated? This condition may be treated by: Scraping off the actinic keratosis (curettage). Freezing the actinic keratosis with liquid nitrogen (cryosurgery). This causes the growth to eventually  fall off. Applying medicated creams or gels to destroy the cells in the growth. Applying chemicals to the growth to make the outer layers of skin peel off (chemical peel). Using photodynamic therapy. In this procedure, medicated cream is applied to the actinic keratosis. This cream increases your skin's sensitivity to light. Then, a strong light is aimed at the actinic keratosis to destroy cells in the growth. Follow these instructions at home: Skin care Apply cool, wet cloths (coolcompresses) to the affected areas. Do not scratch your skin. Check your skin regularly for any growths, especially ones that: Start to itch or bleed. Change in size, shape, or color. Caring for the treated area Keep the treated area clean and dry as told by your health care provider. Do not apply any medicine, cream, or lotion to the treated area unless your health care provider tells you to do that. Do not pick at blisters or try to break them open. This can cause infection and scarring. If you have red or irritated skin after treatment, follow instructions from your health care provider about how to take care of the treated area. Make sure you: Wash your hands with soap and water for at least 20 seconds before and after you change your bandage (dressing). If soap and water are not available, use hand sanitizer. Change your dressing as told by your health care provider. If you have red or irritated skin after treatment, check the treated area every day for signs of infection. Check for: Redness, swelling, or pain. Fluid or blood. Warmth. Pus or a bad  smell. Lifestyle Do not use any products that contain nicotine or tobacco. These products include cigarettes, chewing tobacco, and vaping devices, such as e-cigarettes. If you need help quitting, ask your health care provider. Take steps to protect your skin from the sun, such as: Avoiding the sun between 10:00 a.m. and 4:00 p.m. This is when the UV light is the  strongest. Using a sunscreen or sunblock with SPF 30 or greater. Applying sunscreen before you are exposed to sunlight and reapplying as often as told by the instructions on the sunscreen container. Wearing protective gear, including: Sunglasses with UV protection. A hat and clothing that protect your skin from sunlight. Avoiding medicines that increase your sensitivity to sunlight when possible. Avoidingtanning beds and other indoor tanning devices. General instructions Take or apply over-the-counter and prescription medicines only as told by your health care provider. Return to your normal activities as told by your health care provider. Ask your health care provider what activities are safe for you. Have a skin exam done every year by a health care provider who is a skin specialist (dermatologist). Keep all follow-up visits. Your health care provider will want to check that the site has healed after treatment. Contact a health care provider if: You notice any changes or new growths on your skin. You have swelling, pain, or redness around your treated area. You have fluid or blood coming from your treated area. Your treated area feels warm to the touch. You have pus or a bad smell coming from your treated area. You have a fever or chills. You have a blister that becomes large and painful. Summary An actinic keratosis is a precancerous growth on the skin.If left untreated, these growths can develop into skin cancer. Check your skin regularly for any growths, especially growths that start to itch or bleed, or change in size, shape, or color. Take steps to protect your skin from the sun. Contact a health care provider if you notice any changes or new growths on your skin. This information is not intended to replace advice given to you by your health care provider. Make sure you discuss any questions you have with your health care provider. Document Revised: 10/24/2021 Document Reviewed:  10/24/2021 Elsevier Patient Education  2024 [employer].

## 2024-05-24 ENCOUNTER — Other Ambulatory Visit: Payer: Self-pay

## 2024-05-24 ENCOUNTER — Ambulatory Visit: Payer: Self-pay | Admitting: Family Medicine

## 2024-05-24 ENCOUNTER — Encounter: Payer: Self-pay | Admitting: Family Medicine

## 2024-05-24 LAB — LIPID PANEL
Chol/HDL Ratio: 3.7 ratio (ref 0.0–5.0)
Cholesterol, Total: 161 mg/dL (ref 100–199)
HDL: 43 mg/dL (ref >39)
LDL Chol Calc (NIH): 92 mg/dL (ref 0–99)
Triglycerides: 149 mg/dL (ref 0–149)
VLDL Cholesterol Cal: 26 mg/dL (ref 5–40)

## 2024-05-24 LAB — CMP14+EGFR
ALT: 18 IU/L (ref 0–44)
AST: 21 IU/L (ref 0–40)
Albumin: 3.9 g/dL (ref 3.8–4.8)
Alkaline Phosphatase: 132 IU/L — ABNORMAL HIGH (ref 47–123)
BUN/Creatinine Ratio: 16 (ref 10–24)
BUN: 15 mg/dL (ref 8–27)
Bilirubin Total: 0.6 mg/dL (ref 0.0–1.2)
CO2: 21 mmol/L (ref 20–29)
Calcium: 9.4 mg/dL (ref 8.6–10.2)
Chloride: 104 mmol/L (ref 96–106)
Creatinine, Ser: 0.96 mg/dL (ref 0.76–1.27)
Globulin, Total: 2.4 g/dL (ref 1.5–4.5)
Glucose: 108 mg/dL — ABNORMAL HIGH (ref 70–99)
Potassium: 4 mmol/L (ref 3.5–5.2)
Sodium: 140 mmol/L (ref 134–144)
Total Protein: 6.3 g/dL (ref 6.0–8.5)
eGFR: 81 mL/min/1.73 (ref >59)

## 2024-05-24 LAB — CBC WITH DIFFERENTIAL/PLATELET
Basophils Absolute: 0.1 x10E3/uL (ref 0.0–0.2)
Basos: 1 %
EOS (ABSOLUTE): 0.1 x10E3/uL (ref 0.0–0.4)
Eos: 2 %
Hematocrit: 42.1 % (ref 37.5–51.0)
Hemoglobin: 13.8 g/dL (ref 13.0–17.7)
Immature Grans (Abs): 0 x10E3/uL (ref 0.0–0.1)
Immature Granulocytes: 0 %
Lymphocytes Absolute: 1.3 x10E3/uL (ref 0.7–3.1)
Lymphs: 19 %
MCH: 32.2 pg (ref 26.6–33.0)
MCHC: 32.8 g/dL (ref 31.5–35.7)
MCV: 98 fL — ABNORMAL HIGH (ref 79–97)
Monocytes Absolute: 0.7 x10E3/uL (ref 0.1–0.9)
Monocytes: 11 %
Neutrophils Absolute: 4.7 x10E3/uL (ref 1.4–7.0)
Neutrophils: 67 %
Platelets: 265 x10E3/uL (ref 150–450)
RBC: 4.29 x10E6/uL (ref 4.14–5.80)
RDW: 11.7 % (ref 11.6–15.4)
WBC: 7 x10E3/uL (ref 3.4–10.8)

## 2024-05-24 LAB — IRON,TIBC AND FERRITIN PANEL
Ferritin: 94 ng/mL (ref 30–400)
Iron Saturation: 43 % (ref 15–55)
Iron: 122 ug/dL (ref 38–169)
Total Iron Binding Capacity: 286 ug/dL (ref 250–450)
UIBC: 164 ug/dL (ref 111–343)

## 2024-05-24 LAB — VITAMIN D 25 HYDROXY (VIT D DEFICIENCY, FRACTURES): Vit D, 25-Hydroxy: 39.6 ng/mL (ref 30.0–100.0)

## 2024-05-24 LAB — TSH: TSH: 2.92 u[IU]/mL (ref 0.450–4.500)

## 2024-05-24 LAB — VITAMIN B12: Vitamin B-12: 256 pg/mL (ref 232–1245)

## 2024-05-24 LAB — PSA: Prostate Specific Ag, Serum: 0.4 ng/mL (ref 0.0–4.0)

## 2024-05-25 ENCOUNTER — Other Ambulatory Visit: Payer: Self-pay | Admitting: Family Medicine

## 2024-05-25 DIAGNOSIS — E782 Mixed hyperlipidemia: Secondary | ICD-10-CM

## 2024-05-25 MED ORDER — EZETIMIBE 10 MG PO TABS: 10.0000 mg | ORAL_TABLET | Freq: Every day | ORAL | 3 refills | Status: AC

## 2024-06-20 ENCOUNTER — Ambulatory Visit: Admitting: Family Medicine

## 2024-06-20 VITALS — BP 140/81 | HR 86 | Temp 98.3°F | Ht 64.25 in | Wt 154.2 lb

## 2024-06-20 DIAGNOSIS — K219 Gastro-esophageal reflux disease without esophagitis: Secondary | ICD-10-CM | POA: Diagnosis not present

## 2024-06-20 DIAGNOSIS — K529 Noninfective gastroenteritis and colitis, unspecified: Secondary | ICD-10-CM

## 2024-06-20 DIAGNOSIS — K625 Hemorrhage of anus and rectum: Secondary | ICD-10-CM

## 2024-06-20 LAB — CBC WITH DIFFERENTIAL/PLATELET
Basophils Absolute: 0.1 x10E3/uL (ref 0.0–0.2)
Basos: 1 %
EOS (ABSOLUTE): 0 x10E3/uL (ref 0.0–0.4)
Eos: 0 %
Hematocrit: 40.7 % (ref 37.5–51.0)
Hemoglobin: 13.5 g/dL (ref 13.0–17.7)
Immature Grans (Abs): 0 x10E3/uL (ref 0.0–0.1)
Immature Granulocytes: 0 %
Lymphocytes Absolute: 1.2 x10E3/uL (ref 0.7–3.1)
Lymphs: 10 %
MCH: 32.5 pg (ref 26.6–33.0)
MCHC: 33.2 g/dL (ref 31.5–35.7)
MCV: 98 fL — ABNORMAL HIGH (ref 79–97)
Monocytes Absolute: 0.9 x10E3/uL (ref 0.1–0.9)
Monocytes: 8 %
Neutrophils Absolute: 9.4 x10E3/uL — ABNORMAL HIGH (ref 1.4–7.0)
Neutrophils: 81 %
Platelets: 283 x10E3/uL (ref 150–450)
RBC: 4.16 x10E6/uL (ref 4.14–5.80)
RDW: 11.6 % (ref 11.6–15.4)
WBC: 11.6 x10E3/uL — ABNORMAL HIGH (ref 3.4–10.8)

## 2024-06-20 MED ORDER — OMEPRAZOLE 40 MG PO CPDR: 40.0000 mg | DELAYED_RELEASE_CAPSULE | Freq: Every day | ORAL | 3 refills | Status: AC

## 2024-06-20 NOTE — Progress Notes (Signed)
 Acute Office Visit  Subjective:     Patient ID: Raymond Morales, male    DOB: 09/18/1945, 78 y.o.   MRN: [id number]  Chief Complaint  Patient presents with   Gastroesophageal Reflux    Gastroesophageal Reflux    History of Present Illness   Raymond Morales is a 78 year old male who presents with burning stomach pain and diarrhea.  He has been experiencing burning epigastric pain intermittently over the last few days. He believes this was triggered after taking alpha lipoic acid without food. He has been on omeprazole  for several years. The burning sensation also intensified after consuming mustard to alleviate a leg cramp, leading to severe stomach pain and sweating over the weekend. The pain subsided after about 30 minutes.  He also reports chronic diarrhea that has been ongoing for years, with a recent episode of blood-tinged stool. He describes the stool as runny and notes that the blood appears pinkish on his tissue after wiping. Despite chronic diarrhea, there have been no changes in bowel habits, and he frequently uses Pepto-Bismol.  No trouble swallowing, significant nausea, vomiting, or fever. He reports very low-grade nausea and no recent changes in strength or energy levels. He continues to work about 20 hours a week and does not feel any different from a week ago.       ROS As per HPI.     Objective:    BP (!) 144/83   Pulse 86   Temp 98.3 F (36.8 C) (Temporal)   Ht 5' 4.25 (1.632 m)   Wt 154 lb 3.2 oz (69.9 kg)   SpO2 96%   BMI 26.26 kg/m    Physical Exam Vitals and nursing note reviewed.  Constitutional:      General: He is not in acute distress.    Appearance: He is not ill-appearing, toxic-appearing or diaphoretic.  Eyes:     General: No scleral icterus. Cardiovascular:     Rate and Rhythm: Normal rate and regular rhythm.     Heart sounds: No murmur heard. Pulmonary:     Effort: Pulmonary effort is normal. No respiratory distress.     Breath  sounds: Normal breath sounds. No wheezing, rhonchi or rales.  Chest:     Chest wall: No tenderness.  Abdominal:     General: Bowel sounds are normal. There is no distension.     Palpations: Abdomen is soft.     Tenderness: There is abdominal tenderness in the epigastric area. There is no guarding or rebound. Negative signs include Murphy's sign and McBurney's sign.  Musculoskeletal:     Right lower leg: No edema.     Left lower leg: No edema.  Skin:    General: Skin is warm and dry.     Coloration: Skin is not jaundiced.  Neurological:     Mental Status: He is alert and oriented to person, place, and time. Mental status is at baseline.  Psychiatric:        Mood and Affect: Mood normal.        Behavior: Behavior normal.     No results found for any visits on 06/20/24.      Assessment & Plan:   Aidynn was seen today for gastroesophageal reflux.  Diagnoses and all orders for this visit:  Gastroesophageal reflux disease, unspecified whether esophagitis present -     CBC with Differential/Platelet -     omeprazole  (PRILOSEC) 40 MG capsule; Take 1 capsule (40 mg total) by mouth daily. -  Cancel: Ambulatory referral to Gastroenterology -     Ambulatory referral to Gastroenterology  Rectal bleeding -     CBC with Differential/Platelet -     Cancel: Ambulatory referral to Gastroenterology -     Ambulatory referral to Gastroenterology  Chronic diarrhea -     Ambulatory referral to Gastroenterology   Assessment and Plan    Rectal bleeding - Refer to GI specialist in [place] for endoscopy and colonoscopy. - Check hemoglobin level.  Chronic diarrhea - Referral to GI specialist.  Gastroesophageal reflux disease (GERD) Long-standing GERD, currently uncontrolled.  - Increase omeprazole  to 40 mg daily. - Advise Tums or Pepcid  as needed.      Return to office for new or worsening symptoms, or if symptoms persist.   The patient indicates understanding of these issues  and agrees with the plan.  Annabella CHRISTELLA Search, FNP

## 2024-06-21 ENCOUNTER — Encounter (INDEPENDENT_AMBULATORY_CARE_PROVIDER_SITE_OTHER): Payer: Self-pay | Admitting: *Deleted

## 2024-06-22 ENCOUNTER — Ambulatory Visit: Payer: Self-pay | Admitting: Family Medicine

## 2024-06-22 DIAGNOSIS — D72828 Other elevated white blood cell count: Secondary | ICD-10-CM

## 2024-07-06 ENCOUNTER — Other Ambulatory Visit

## 2024-07-06 DIAGNOSIS — D72828 Other elevated white blood cell count: Secondary | ICD-10-CM | POA: Diagnosis not present

## 2024-07-06 LAB — CBC WITH DIFFERENTIAL/PLATELET
Basophils Absolute: 0.1 x10E3/uL (ref 0.0–0.2)
Basos: 1 %
EOS (ABSOLUTE): 0.1 x10E3/uL (ref 0.0–0.4)
Eos: 1 %
Hematocrit: 40.8 % (ref 37.5–51.0)
Hemoglobin: 13.4 g/dL (ref 13.0–17.7)
Immature Grans (Abs): 0 x10E3/uL (ref 0.0–0.1)
Immature Granulocytes: 0 %
Lymphocytes Absolute: 1.5 x10E3/uL (ref 0.7–3.1)
Lymphs: 21 %
MCH: 32.5 pg (ref 26.6–33.0)
MCHC: 32.8 g/dL (ref 31.5–35.7)
MCV: 99 fL — ABNORMAL HIGH (ref 79–97)
Monocytes Absolute: 0.8 x10E3/uL (ref 0.1–0.9)
Monocytes: 11 %
Neutrophils Absolute: 4.9 x10E3/uL (ref 1.4–7.0)
Neutrophils: 66 %
Platelets: 299 x10E3/uL (ref 150–450)
RBC: 4.12 x10E6/uL — ABNORMAL LOW (ref 4.14–5.80)
RDW: 11.5 % — ABNORMAL LOW (ref 11.6–15.4)
WBC: 7.4 x10E3/uL (ref 3.4–10.8)

## 2024-07-07 ENCOUNTER — Ambulatory Visit: Payer: Self-pay | Admitting: Family Medicine

## 2024-07-13 ENCOUNTER — Telehealth (INDEPENDENT_AMBULATORY_CARE_PROVIDER_SITE_OTHER): Payer: Self-pay

## 2024-07-13 ENCOUNTER — Encounter (INDEPENDENT_AMBULATORY_CARE_PROVIDER_SITE_OTHER): Payer: Self-pay | Admitting: Gastroenterology

## 2024-07-13 ENCOUNTER — Ambulatory Visit (INDEPENDENT_AMBULATORY_CARE_PROVIDER_SITE_OTHER): Admitting: Gastroenterology

## 2024-07-13 VITALS — BP 135/82 | HR 75 | Temp 98.3°F | Ht 64.0 in | Wt 154.5 lb

## 2024-07-13 DIAGNOSIS — K219 Gastro-esophageal reflux disease without esophagitis: Secondary | ICD-10-CM | POA: Diagnosis not present

## 2024-07-13 DIAGNOSIS — K921 Melena: Secondary | ICD-10-CM | POA: Insufficient documentation

## 2024-07-13 DIAGNOSIS — R1906 Epigastric swelling, mass or lump: Secondary | ICD-10-CM | POA: Diagnosis not present

## 2024-07-13 DIAGNOSIS — R1319 Other dysphagia: Secondary | ICD-10-CM | POA: Diagnosis not present

## 2024-07-13 NOTE — Progress Notes (Signed)
 Cotey Rakes Faizan Amariah Kierstead , M.D. Gastroenterology & Hepatology [facility] [address] [place], [place] [address] Primary Care Physician: Jolinda Norene HERO, DO [address] [place] [address]  Chief Complaint: GERD, epigastric burning, hematochezia History of Present Illness: Raymond Morales is a 78 y.o. male with BPH, hypertension who presents for evaluation of GERD, epigastric burning and  hematochezia.  Patient reports that he was taking turmeric powder after which he started getting abdominal pain and diarrhea.  During the same time he noticed his stool was red in color and thought it was blood.  Patient reports the night before he had cranberry juice and things it may have been the juice itself.  Patient was taking omeprazole  20 mg for 5 years recently started getting worsening epigastric burning and reflux.  Patient has occasional solid food dysphagia where he explains as food getting hung in the chest The patient denies having any  fever, chills, melena, hematemesis, abdominal distention, diarrhea, jaundice, pruritus or weight loss.  Last ZHI:Wnwz Last Colonoscopy:none  FHx: neg for any gastrointestinal/liver disease, no malignancies Social: neg smoking, alcohol or illicit drug use Surgical: no abdominal surgeries  Past Medical History: Past Medical History:  Diagnosis Date   Allergy    seasonal   BPH (benign prostatic hypertrophy)    Hyperlipidemia     Past Surgical History:History reviewed. No pertinent surgical history.  Family History: Family History  Problem Relation Age of Onset   Hypertension Mother    Vision loss Mother    Atrial fibrillation Mother    Heart disease Father    Thyroid  disease Father    Hypertension Sister    Atrial fibrillation Sister     Social History: Social History   Tobacco Use  Smoking Status Never  Smokeless Tobacco Never   Social History   Substance and Sexual Activity  Alcohol Use  No   Social History   Substance and Sexual Activity  Drug Use No    Allergies: Allergies  Allergen Reactions   Latex     Other reaction(s): Unknown    Medications: Current Outpatient Medications  Medication Sig Dispense Refill   Alpha-Lipoic Acid 600 MG TABS Take 600 mg by mouth daily. For neuropathy of feet 100 tablet 3   atorvastatin  (LIPITOR) 10 MG tablet Take 1 tablet (10 mg total) by mouth daily. 100 tablet 3   azelastine  (ASTELIN ) 0.1 % nasal spray Place 1 spray into both nostrils 2 (two) times daily. 30 mL prn   celecoxib  (CELEBREX ) 100 MG capsule Take 1 capsule (100 mg total) by mouth daily as needed for moderate pain (pain score 4-6) (take with food). 100 capsule 3   ezetimibe (ZETIA) 10 MG tablet Take 1 tablet (10 mg total) by mouth daily. 90 tablet 3   finasteride  (PROSCAR ) 5 MG tablet Take 1 tablet (5 mg total) by mouth daily. 100 tablet 3   fluorouracil (EFUDEX) 5 % cream Apply topically 2 (two) times daily. X4 weeks on scalp 40 g 1   fluticasone  (FLONASE ) 50 MCG/ACT nasal spray Place 2 sprays into both nostrils daily. 16 g 6   loratadine (CLARITIN) 10 MG tablet Take 10 mg by mouth daily.     mirtazapine  (REMERON ) 7.5 MG tablet Take 1 tablet (7.5 mg total) by mouth at bedtime. (Patient taking differently: Take 7.5 mg by mouth at bedtime. PRN) 90 tablet 0   omeprazole  (PRILOSEC) 40 MG capsule Take 1 capsule (40 mg total) by mouth daily. 30 capsule 3  tamsulosin  (FLOMAX ) 0.4 MG CAPS capsule Take 1 capsule (0.4 mg total) by mouth daily. 100 capsule 3   triamcinolone  cream (KENALOG ) 0.1 % Apply 1 Application topically 2 (two) times daily. x10 days (if needed for allergic rash) 45 g 1   No current facility-administered medications for this visit.    Review of Systems: GENERAL: negative for malaise, night sweats HEENT: No changes in hearing or vision, no nose bleeds or other nasal problems. NECK: Negative for lumps, goiter, pain and significant neck  swelling RESPIRATORY: Negative for cough, wheezing CARDIOVASCULAR: Negative for chest pain, leg swelling, palpitations, orthopnea GI: SEE HPI MUSCULOSKELETAL: Negative for joint pain or swelling, back pain, and muscle pain. SKIN: Negative for lesions, rash HEMATOLOGY Negative for prolonged bleeding, bruising easily, and swollen nodes. ENDOCRINE: Negative for cold or heat intolerance, polyuria, polydipsia and goiter. NEURO: negative for tremor, gait imbalance, syncope and seizures. The remainder of the review of systems is noncontributory.   Physical Exam: BP 135/82   Pulse 75   Temp 98.3 F (36.8 C)   Ht 5' 4 (1.626 m)   Wt 154 lb 8 oz (70.1 kg)   BMI 26.52 kg/m  GENERAL: The patient is AO x3, in no acute distress. HEENT: Head is normocephalic and atraumatic. EOMI are intact. Mouth is well hydrated and without lesions. NECK: Supple. No masses LUNGS: Clear to auscultation. No presence of rhonchi/wheezing/rales. Adequate chest expansion HEART: RRR, normal s1 and s2. ABDOMEN: Soft, nontender, no guarding, no peritoneal signs, and nondistended. BS +. Epigastric mobile lump palpated on exam  : Imaging/Labs: as above     Latest Ref Rng & Units 07/06/2024    1:40 PM 06/20/2024   12:27 PM 05/23/2024    8:57 AM  CBC  WBC 3.4 - 10.8 x10E3/uL 7.4  11.6  7.0   Hemoglobin 13.0 - 17.7 g/dL 86.5  86.4  86.1   Hematocrit 37.5 - 51.0 % 40.8  40.7  42.1   Platelets 150 - 450 x10E3/uL 299  283  265    Lab Results  Component Value Date   IRON 122 05/23/2024   TIBC 286 05/23/2024   FERRITIN 94 05/23/2024    I personally reviewed and interpreted the available labs, imaging and endoscopic files.  Impression and Plan:  Raymond Morales is a 78 y.o. male with BPH, hypertension who presents for evaluation of GERD, epigastric burning and  hematochezia.  #GERD/Dysphagia   Patient with occasional solid food dysphagia with worsening GERD despite being on PPI for years.  This is considered  refractory GERD and upper endoscopy is indicated in this patient advanced age  Continue omeprazole  40 mg daily Upper endoscopy with biopsy/dilation  #Mobile abdominal lesion  On exam today patient has mobile epigastric lump/mass.  Probably benign entity such as lipoma but given advanced age and new upper GI symptoms will obtain cross-sectional imaging with CT abdomen pelvis  #Questionable hematochezia   Patient was referred for hematochezia and had red colour stools, which patient is adamant was rather cranberry juice.  Although with advanced age and no previous colonoscopies there is a strong indication for colonoscopy at this time to ensure we are not dealing with any polyp, ulceration or malignancy , I had extensive discussion with patient that without colonoscopy I am unable to comfortably clear his colon and we may be missing a lesion such as malignancy.  He verbalizes understanding and refusing colonoscopy because he does not want to do bowel prep.  I had given patient different options for  bowel prep including pills and low-volume bowel prep.  Patient refused despite much counseling  He will let us  know if he changes his mind about colonoscopy.  All questions were answered.      Raymond Quintero Faizan Kiefer Opheim, MD Gastroenterology and Hepatology [facility]   This chart has been completed using [facility] Dictation software, and while attempts have been made to ensure accuracy , certain words and phrases may not be transcribed as intended

## 2024-07-13 NOTE — Telephone Encounter (Signed)
 Spoke with patient, informed him of CT scan on 08/01/2024 at 8:30. Patient aware and verbalized understanding.  Scheduled EGD for 08/05/2024 at 1:00pm. Instructions sent to mychart.

## 2024-07-13 NOTE — H&P (View-Only) (Signed)
 Cotey Rakes Faizan Amariah Kierstead , M.D. Gastroenterology & Hepatology [facility] [address] [place], [place] [address] Primary Care Physician: Jolinda Norene HERO, DO [address] [place] [address]  Chief Complaint: GERD, epigastric burning, hematochezia History of Present Illness: Raymond Morales is a 78 y.o. male with BPH, hypertension who presents for evaluation of GERD, epigastric burning and  hematochezia.  Patient reports that he was taking turmeric powder after which he started getting abdominal pain and diarrhea.  During the same time he noticed his stool was red in color and thought it was blood.  Patient reports the night before he had cranberry juice and things it may have been the juice itself.  Patient was taking omeprazole  20 mg for 5 years recently started getting worsening epigastric burning and reflux.  Patient has occasional solid food dysphagia where he explains as food getting hung in the chest The patient denies having any  fever, chills, melena, hematemesis, abdominal distention, diarrhea, jaundice, pruritus or weight loss.  Last ZHI:Wnwz Last Colonoscopy:none  FHx: neg for any gastrointestinal/liver disease, no malignancies Social: neg smoking, alcohol or illicit drug use Surgical: no abdominal surgeries  Past Medical History: Past Medical History:  Diagnosis Date   Allergy    seasonal   BPH (benign prostatic hypertrophy)    Hyperlipidemia     Past Surgical History:History reviewed. No pertinent surgical history.  Family History: Family History  Problem Relation Age of Onset   Hypertension Mother    Vision loss Mother    Atrial fibrillation Mother    Heart disease Father    Thyroid  disease Father    Hypertension Sister    Atrial fibrillation Sister     Social History: Social History   Tobacco Use  Smoking Status Never  Smokeless Tobacco Never   Social History   Substance and Sexual Activity  Alcohol Use  No   Social History   Substance and Sexual Activity  Drug Use No    Allergies: Allergies  Allergen Reactions   Latex     Other reaction(s): Unknown    Medications: Current Outpatient Medications  Medication Sig Dispense Refill   Alpha-Lipoic Acid 600 MG TABS Take 600 mg by mouth daily. For neuropathy of feet 100 tablet 3   atorvastatin  (LIPITOR) 10 MG tablet Take 1 tablet (10 mg total) by mouth daily. 100 tablet 3   azelastine  (ASTELIN ) 0.1 % nasal spray Place 1 spray into both nostrils 2 (two) times daily. 30 mL prn   celecoxib  (CELEBREX ) 100 MG capsule Take 1 capsule (100 mg total) by mouth daily as needed for moderate pain (pain score 4-6) (take with food). 100 capsule 3   ezetimibe (ZETIA) 10 MG tablet Take 1 tablet (10 mg total) by mouth daily. 90 tablet 3   finasteride  (PROSCAR ) 5 MG tablet Take 1 tablet (5 mg total) by mouth daily. 100 tablet 3   fluorouracil (EFUDEX) 5 % cream Apply topically 2 (two) times daily. X4 weeks on scalp 40 g 1   fluticasone  (FLONASE ) 50 MCG/ACT nasal spray Place 2 sprays into both nostrils daily. 16 g 6   loratadine (CLARITIN) 10 MG tablet Take 10 mg by mouth daily.     mirtazapine  (REMERON ) 7.5 MG tablet Take 1 tablet (7.5 mg total) by mouth at bedtime. (Patient taking differently: Take 7.5 mg by mouth at bedtime. PRN) 90 tablet 0   omeprazole  (PRILOSEC) 40 MG capsule Take 1 capsule (40 mg total) by mouth daily. 30 capsule 3  tamsulosin  (FLOMAX ) 0.4 MG CAPS capsule Take 1 capsule (0.4 mg total) by mouth daily. 100 capsule 3   triamcinolone  cream (KENALOG ) 0.1 % Apply 1 Application topically 2 (two) times daily. x10 days (if needed for allergic rash) 45 g 1   No current facility-administered medications for this visit.    Review of Systems: GENERAL: negative for malaise, night sweats HEENT: No changes in hearing or vision, no nose bleeds or other nasal problems. NECK: Negative for lumps, goiter, pain and significant neck  swelling RESPIRATORY: Negative for cough, wheezing CARDIOVASCULAR: Negative for chest pain, leg swelling, palpitations, orthopnea GI: SEE HPI MUSCULOSKELETAL: Negative for joint pain or swelling, back pain, and muscle pain. SKIN: Negative for lesions, rash HEMATOLOGY Negative for prolonged bleeding, bruising easily, and swollen nodes. ENDOCRINE: Negative for cold or heat intolerance, polyuria, polydipsia and goiter. NEURO: negative for tremor, gait imbalance, syncope and seizures. The remainder of the review of systems is noncontributory.   Physical Exam: BP 135/82   Pulse 75   Temp 98.3 F (36.8 C)   Ht 5' 4 (1.626 m)   Wt 154 lb 8 oz (70.1 kg)   BMI 26.52 kg/m  GENERAL: The patient is AO x3, in no acute distress. HEENT: Head is normocephalic and atraumatic. EOMI are intact. Mouth is well hydrated and without lesions. NECK: Supple. No masses LUNGS: Clear to auscultation. No presence of rhonchi/wheezing/rales. Adequate chest expansion HEART: RRR, normal s1 and s2. ABDOMEN: Soft, nontender, no guarding, no peritoneal signs, and nondistended. BS +. Epigastric mobile lump palpated on exam  : Imaging/Labs: as above     Latest Ref Rng & Units 07/06/2024    1:40 PM 06/20/2024   12:27 PM 05/23/2024    8:57 AM  CBC  WBC 3.4 - 10.8 x10E3/uL 7.4  11.6  7.0   Hemoglobin 13.0 - 17.7 g/dL 86.5  86.4  86.1   Hematocrit 37.5 - 51.0 % 40.8  40.7  42.1   Platelets 150 - 450 x10E3/uL 299  283  265    Lab Results  Component Value Date   IRON 122 05/23/2024   TIBC 286 05/23/2024   FERRITIN 94 05/23/2024    I personally reviewed and interpreted the available labs, imaging and endoscopic files.  Impression and Plan:  Raymond Morales is a 78 y.o. male with BPH, hypertension who presents for evaluation of GERD, epigastric burning and  hematochezia.  #GERD/Dysphagia   Patient with occasional solid food dysphagia with worsening GERD despite being on PPI for years.  This is considered  refractory GERD and upper endoscopy is indicated in this patient advanced age  Continue omeprazole  40 mg daily Upper endoscopy with biopsy/dilation  #Mobile abdominal lesion  On exam today patient has mobile epigastric lump/mass.  Probably benign entity such as lipoma but given advanced age and new upper GI symptoms will obtain cross-sectional imaging with CT abdomen pelvis  #Questionable hematochezia   Patient was referred for hematochezia and had red colour stools, which patient is adamant was rather cranberry juice.  Although with advanced age and no previous colonoscopies there is a strong indication for colonoscopy at this time to ensure we are not dealing with any polyp, ulceration or malignancy , I had extensive discussion with patient that without colonoscopy I am unable to comfortably clear his colon and we may be missing a lesion such as malignancy.  He verbalizes understanding and refusing colonoscopy because he does not want to do bowel prep.  I had given patient different options for  bowel prep including pills and low-volume bowel prep.  Patient refused despite much counseling  He will let us  know if he changes his mind about colonoscopy.  All questions were answered.      Raymond Quintero Faizan Kiefer Opheim, MD Gastroenterology and Hepatology [facility]   This chart has been completed using [facility] Dictation software, and while attempts have been made to ensure accuracy , certain words and phrases may not be transcribed as intended

## 2024-07-13 NOTE — Telephone Encounter (Signed)
 Prior Auth not required for this patient.

## 2024-07-13 NOTE — Patient Instructions (Signed)
 It was very nice to meet you today, as dicussed with will plan for the following :   1) CT Abdomen and pelvis  2) Upper endoscopy

## 2024-07-14 DIAGNOSIS — Z23 Encounter for immunization: Secondary | ICD-10-CM | POA: Diagnosis not present

## 2024-07-20 ENCOUNTER — Other Ambulatory Visit: Payer: Self-pay | Admitting: Family Medicine

## 2024-07-20 DIAGNOSIS — E782 Mixed hyperlipidemia: Secondary | ICD-10-CM

## 2024-07-20 DIAGNOSIS — N400 Enlarged prostate without lower urinary tract symptoms: Secondary | ICD-10-CM

## 2024-08-01 ENCOUNTER — Ambulatory Visit (HOSPITAL_COMMUNITY)
Admission: RE | Admit: 2024-08-01 | Discharge: 2024-08-01 | Disposition: A | Source: Ambulatory Visit | Attending: Gastroenterology

## 2024-08-01 DIAGNOSIS — N400 Enlarged prostate without lower urinary tract symptoms: Secondary | ICD-10-CM | POA: Diagnosis not present

## 2024-08-01 DIAGNOSIS — K5730 Diverticulosis of large intestine without perforation or abscess without bleeding: Secondary | ICD-10-CM | POA: Diagnosis not present

## 2024-08-01 DIAGNOSIS — R1906 Epigastric swelling, mass or lump: Secondary | ICD-10-CM

## 2024-08-01 MED ORDER — IOHEXOL 300 MG/ML  SOLN
100.0000 mL | Freq: Once | INTRAMUSCULAR | Status: AC | PRN
  Administered 2024-08-01: 100 mL via INTRAVENOUS

## 2024-08-02 ENCOUNTER — Ambulatory Visit (INDEPENDENT_AMBULATORY_CARE_PROVIDER_SITE_OTHER): Payer: Self-pay | Admitting: Gastroenterology

## 2024-08-05 ENCOUNTER — Ambulatory Visit (HOSPITAL_COMMUNITY)
Admission: RE | Admit: 2024-08-05 | Discharge: 2024-08-05 | Disposition: A | Attending: Gastroenterology | Admitting: Gastroenterology

## 2024-08-05 ENCOUNTER — Ambulatory Visit (HOSPITAL_COMMUNITY): Admitting: Anesthesiology

## 2024-08-05 ENCOUNTER — Encounter (HOSPITAL_COMMUNITY): Payer: Self-pay | Admitting: Gastroenterology

## 2024-08-05 ENCOUNTER — Encounter (HOSPITAL_COMMUNITY): Admission: RE | Disposition: A | Payer: Self-pay | Source: Home / Self Care | Attending: Gastroenterology

## 2024-08-05 DIAGNOSIS — F419 Anxiety disorder, unspecified: Secondary | ICD-10-CM | POA: Diagnosis not present

## 2024-08-05 DIAGNOSIS — R1310 Dysphagia, unspecified: Secondary | ICD-10-CM | POA: Diagnosis present

## 2024-08-05 DIAGNOSIS — T478X5A Adverse effect of other agents primarily affecting gastrointestinal system, initial encounter: Secondary | ICD-10-CM

## 2024-08-05 DIAGNOSIS — K222 Esophageal obstruction: Secondary | ICD-10-CM | POA: Diagnosis not present

## 2024-08-05 DIAGNOSIS — K3189 Other diseases of stomach and duodenum: Secondary | ICD-10-CM | POA: Diagnosis not present

## 2024-08-05 DIAGNOSIS — K2970 Gastritis, unspecified, without bleeding: Secondary | ICD-10-CM | POA: Diagnosis not present

## 2024-08-05 DIAGNOSIS — K317 Polyp of stomach and duodenum: Secondary | ICD-10-CM

## 2024-08-05 DIAGNOSIS — R1013 Epigastric pain: Secondary | ICD-10-CM | POA: Diagnosis not present

## 2024-08-05 DIAGNOSIS — K219 Gastro-esophageal reflux disease without esophagitis: Secondary | ICD-10-CM | POA: Diagnosis not present

## 2024-08-05 DIAGNOSIS — Z79899 Other long term (current) drug therapy: Secondary | ICD-10-CM | POA: Diagnosis not present

## 2024-08-05 DIAGNOSIS — I10 Essential (primary) hypertension: Secondary | ICD-10-CM | POA: Diagnosis not present

## 2024-08-05 DIAGNOSIS — N400 Enlarged prostate without lower urinary tract symptoms: Secondary | ICD-10-CM | POA: Diagnosis not present

## 2024-08-05 SURGERY — EGD (ESOPHAGOGASTRODUODENOSCOPY)
Anesthesia: Monitor Anesthesia Care

## 2024-08-05 MED ORDER — EPHEDRINE SULFATE (PRESSORS) 25 MG/5ML IV SOSY
PREFILLED_SYRINGE | INTRAVENOUS | Status: DC | PRN
  Administered 2024-08-05: 2.5 mg via INTRAVENOUS
  Administered 2024-08-05: 7.5 mg via INTRAVENOUS
  Administered 2024-08-05: 2.5 mg via INTRAVENOUS

## 2024-08-05 MED ORDER — LACTATED RINGERS IV SOLN: INTRAVENOUS | Status: DC

## 2024-08-05 MED ORDER — PROPOFOL 500 MG/50ML IV EMUL
INTRAVENOUS | Status: DC | PRN
  Administered 2024-08-05: 50 mg via INTRAVENOUS
  Administered 2024-08-05: 150 mg via INTRAVENOUS
  Administered 2024-08-05: 25 mg via INTRAVENOUS

## 2024-08-05 MED ADMIN — SIMETHICONE IN STERILE WATER 1000ML IRRIG. (ENDO): 60 mL | NDC 99999020140

## 2024-08-05 NOTE — Anesthesia Preprocedure Evaluation (Signed)
 Anesthesia Evaluation  Patient identified by MRN, date of birth, ID band Patient awake    Reviewed: Allergy & Precautions, H&P , NPO status , Patient's Chart, lab work & pertinent test results, reviewed documented beta blocker date and time   Airway Mallampati: II  TM Distance: >3 FB Neck ROM: full    Dental no notable dental hx.    Pulmonary neg pulmonary ROS   Pulmonary exam normal breath sounds clear to auscultation       Cardiovascular Exercise Tolerance: Good hypertension, negative cardio ROS  Rhythm:regular Rate:Normal     Neuro/Psych   Anxiety     negative neurological ROS  negative psych ROS   GI/Hepatic Neg liver ROS,GERD  ,,  Endo/Other  negative endocrine ROS    Renal/GU negative Renal ROS  negative genitourinary   Musculoskeletal   Abdominal   Peds  Hematology negative hematology ROS (+)   Anesthesia Other Findings   Reproductive/Obstetrics negative OB ROS                              Anesthesia Physical Anesthesia Plan  ASA: 2  Anesthesia Plan: MAC   Post-op Pain Management:    Induction:   PONV Risk Score and Plan: Propofol  infusion  Airway Management Planned:   Additional Equipment:   Intra-op Plan:   Post-operative Plan:   Informed Consent: I have reviewed the patients History and Physical, chart, labs and discussed the procedure including the risks, benefits and alternatives for the proposed anesthesia with the patient or authorized representative who has indicated his/her understanding and acceptance.     Dental Advisory Given  Plan Discussed with: CRNA  Anesthesia Plan Comments:         Anesthesia Quick Evaluation

## 2024-08-05 NOTE — Interval H&P Note (Signed)
 History and Physical Interval Note:  08/05/2024 11:18 AM  Raymond Morales  has presented today for surgery, with the diagnosis of abdominal mass, gastroesophageal reflux deisease, dysphagia, hematochezia.  The various methods of treatment have been discussed with the patient and family. After consideration of risks, benefits and other options for treatment, the patient has consented to  Procedures with comments: EGD (ESOPHAGOGASTRODUODENOSCOPY) (N/A) - 1:00pm, ASA 1-2 as a surgical intervention.  The patient's history has been reviewed, patient examined, no change in status, stable for surgery.  I have reviewed the patient's chart and labs.  Questions were answered to the patient's satisfaction.     Deatrice FALCON Ayssa Bentivegna

## 2024-08-05 NOTE — Anesthesia Procedure Notes (Addendum)
 Date/Time: 08/05/2024 11:29 AM  Performed by: Barbarann Verneita RAMAN, CRNAPre-anesthesia Checklist: Patient identified, Emergency Drugs available, Suction available, Timeout performed and Patient being monitored Patient Re-evaluated:Patient Re-evaluated prior to induction Oxygen Delivery Method: Nasal cannula Comments: Optiflow

## 2024-08-05 NOTE — Discharge Instructions (Signed)

## 2024-08-05 NOTE — Op Note (Signed)
 [facility] Patient Name: Raymond Morales Procedure Date: 08/05/2024 11:19 AM MRN: [id number] Date of Birth: 1946/01/17 Attending MD: Deatrice Dine , MD, [id number] CSN: [id number] Age: 78 Admit Type: Outpatient Procedure:                Upper GI endoscopy Indications:              Epigastric abdominal pain, Dysphagia Providers:                Deatrice Dine, MD, Devere Lodge, Bascom Blush Referring MD:              Medicines:                 Complications:            No immediate complications. Estimated Blood Loss:     Estimated blood loss was minimal. Procedure:                Pre-Anesthesia Assessment:                           - Prior to the procedure, a History and Physical                            was performed, and patient medications and                            allergies were reviewed. The patient's tolerance of                            previous anesthesia was also reviewed. The risks                            and benefits of the procedure and the sedation                            options and risks were discussed with the patient.                            All questions were answered, and informed consent                            was obtained. Prior Anticoagulants: The patient has                            taken no anticoagulant or antiplatelet agents. ASA                            Grade Assessment: II - A patient with mild systemic                            disease. After reviewing the risks and benefits,                            the patient was deemed in satisfactory condition to  undergo the procedure.                           After obtaining informed consent, the endoscope was                            passed under direct vision. Throughout the                            procedure, the patient's blood pressure, pulse, and                            oxygen saturations were monitored continuously. The                             [id number] ([id number]) Upper was introduced through                            the mouth, and advanced to the second part of                            duodenum. The upper GI endoscopy was accomplished                            without difficulty. The patient tolerated the                            procedure well. Scope In: 11:35:13 AM Scope Out: 11:44:07 AM Total Procedure Duration: 0 hours 8 minutes 54 seconds  Findings:      A non-obstructing Schatzki ring was found at the gastroesophageal       junction. A TTS dilator was passed through the scope. Dilation with an       18-19-20 mm balloon dilator was performed to 20 mm. The dilation site       was examined following endoscope reinsertion and showed moderate mucosal       disruption.      Mild inflammation characterized by erythema was found in the gastric       antrum. Biopsies were taken with a cold forceps for histology.      Multiple sessile polyps with no bleeding and no stigmata of recent       bleeding were found in the stomach. The polyp was removed with a cold       snare. Resection and retrieval were complete.      The duodenal bulb and second portion of the duodenum were normal. Impression:               - Non-obstructing Schatzki ring. Dilated.                           - Gastritis. Biopsied.                           - Multiple gastric polyps. Resected and retrieved.                           - Normal duodenal bulb and  second portion of the                            duodenum. Moderate Sedation:      Per Anesthesia Care Recommendation:           - Patient has a contact number available for                            emergencies. The signs and symptoms of potential                            delayed complications were discussed with the                            patient. Return to normal activities tomorrow.                            Written discharge instructions were provided to the                             patient.                           - Resume previous diet.                           - Continue present medications.                           - Await pathology results. Procedure Code(s):        --- Professional ---                           [phone], Esophagogastroduodenoscopy, flexible,                            transoral; with removal of tumor(s), polyp(s), or                            other lesion(s) by snare technique                           43249, Esophagogastroduodenoscopy, flexible,                            transoral; with transendoscopic balloon dilation of                            esophagus (less than 30 mm diameter)                           43239, 59, Esophagogastroduodenoscopy, flexible,                            transoral; with biopsy, single or multiple Diagnosis Code(s):        --- Professional ---  K22.2, Esophageal obstruction                           K29.70, Gastritis, unspecified, without bleeding                           K31.7, Polyp of stomach and duodenum                           R10.13, Epigastric pain                           R13.10, Dysphagia, unspecified CPT copyright 2022 American Medical Association. All rights reserved. The codes documented in this report are preliminary and upon coder review may  be revised to meet current compliance requirements. Deatrice Dine, MD Deatrice Dine, MD 08/05/2024 11:52:01 AM This report has been signed electronically. Number of Addenda: 0

## 2024-08-05 NOTE — Transfer of Care (Signed)
 Immediate Anesthesia Transfer of Care Note  Patient: Raymond Morales  Procedure(s) Performed: EGD (ESOPHAGOGASTRODUODENOSCOPY) DILATION, ESOPHAGUS POLYPECTOMY, INTESTINE  Patient Location: Endoscopy Unit  Anesthesia Type:MAC  Level of Consciousness: drowsy and patient cooperative  Airway & Oxygen Therapy: Patient Spontanous Breathing  Post-op Assessment: Report given to RN and Post -op Vital signs reviewed and stable  Post vital signs: Reviewed and stable  Last Vitals:  Vitals Value Taken Time  BP 88/53 08/05/24 11:49  Temp 36.4 C 08/05/24 11:49  Pulse 81 08/05/24 11:49  Resp 16 08/05/24 11:49  SpO2 96 % 08/05/24 11:49    Last Pain:  Vitals:   08/05/24 1149  TempSrc: Oral  PainSc:       Patients Stated Pain Goal: 6 (08/05/24 1123)  Complications: No notable events documented.

## 2024-08-08 ENCOUNTER — Encounter (HOSPITAL_COMMUNITY): Payer: Self-pay | Admitting: Gastroenterology

## 2024-08-08 ENCOUNTER — Ambulatory Visit (INDEPENDENT_AMBULATORY_CARE_PROVIDER_SITE_OTHER): Payer: Self-pay | Admitting: Gastroenterology

## 2024-08-08 LAB — SURGICAL PATHOLOGY

## 2024-08-11 ENCOUNTER — Other Ambulatory Visit: Payer: Self-pay | Admitting: Family Medicine

## 2024-08-11 DIAGNOSIS — K219 Gastro-esophageal reflux disease without esophagitis: Secondary | ICD-10-CM

## 2024-08-12 NOTE — Anesthesia Postprocedure Evaluation (Signed)
"   Anesthesia Post Note  Patient: Raymond Morales  Procedure(s) Performed: EGD (ESOPHAGOGASTRODUODENOSCOPY) DILATION, ESOPHAGUS POLYPECTOMY, INTESTINE  Patient location during evaluation: Phase II Anesthesia Type: MAC Level of consciousness: awake Pain management: pain level controlled Vital Signs Assessment: post-procedure vital signs reviewed and stable Respiratory status: spontaneous breathing and respiratory function stable Cardiovascular status: blood pressure returned to baseline and stable Postop Assessment: no headache and no apparent nausea or vomiting Anesthetic complications: no Comments: Late entry   No notable events documented.   Last Vitals:  Vitals:   08/05/24 1153 08/05/24 1154  BP: (!) 109/59 113/76  Pulse: 91 93  Resp: 20 20  Temp:    SpO2: 95% 95%    Last Pain:  Vitals:   08/05/24 1154  TempSrc:   PainSc: 0-No pain                 Yvonna PARAS Dakota Stangl      "

## 2024-08-12 NOTE — Progress Notes (Signed)
 Patient result letter mailed Patient's PCP is on EPIC

## 2024-09-19 ENCOUNTER — Ambulatory Visit: Payer: Self-pay

## 2024-09-19 VITALS — BP 135/82 | HR 75 | Ht 64.0 in | Wt 154.0 lb

## 2024-09-19 DIAGNOSIS — Z0000 Encounter for general adult medical examination without abnormal findings: Secondary | ICD-10-CM

## 2024-09-19 NOTE — Patient Instructions (Signed)
 Raymond Morales,  Thank you for taking the time for your Medicare Wellness Visit. I appreciate your continued commitment to your health goals. Please review the care plan we discussed, and feel free to reach out if I can assist you further.  Please note that Annual Wellness Visits do not include a physical exam. Some assessments may be limited, especially if the visit was conducted virtually. If needed, we may recommend an in-person follow-up with your provider.  Ongoing Care Seeing your primary care provider every 3 to 6 months helps us  monitor your health and provide consistent, personalized care.   Referrals If a referral was made during today's visit and you haven't received any updates within two weeks, please contact the referred provider directly to check on the status.  Recommended Screenings:  Health Maintenance  Topic Date Due   Medicare Annual Wellness Visit  09/16/2024   COVID-19 Vaccine (5 - Moderna risk 2025-26 season) 01/11/2025   DTaP/Tdap/Td vaccine (2 - Td or Tdap) 05/23/2034   Pneumococcal Vaccine for age over 67  Completed   Flu Shot  Completed   Hepatitis C Screening  Completed   Zoster (Shingles) Vaccine  Completed   Meningitis B Vaccine  Aged Out       09/17/2024   11:59 PM  Advanced Directives  Does Patient Have a Medical Advance Directive? No  Would patient like information on creating a medical advance directive? No - Patient declined    Vision: Annual vision screenings are recommended for early detection of glaucoma, cataracts, and diabetic retinopathy. These exams can also reveal signs of chronic conditions such as diabetes and high blood pressure.  Dental: Annual dental screenings help detect early signs of oral cancer, gum disease, and other conditions linked to overall health, including heart disease and diabetes.  Please see the attached documents for additional preventive care recommendations.

## 2024-09-19 NOTE — Progress Notes (Signed)
 "  Chief Complaint  Patient presents with   Medicare Wellness     Subjective:   Raymond Morales is a 79 y.o. male who presents for a Medicare Annual Wellness Visit.  Visit info / Clinical Intake: Medicare Wellness Visit Type:: Subsequent Annual Wellness Visit Persons participating in visit and providing information:: patient Medicare Wellness Visit Mode:: Telephone If telephone:: video declined Since this visit was completed virtually, some vitals may be partially provided or unavailable. Missing vitals are due to the limitations of the virtual format.: Unable to obtain vitals - no equipment If Telephone or Video please confirm:: I connected with patient using audio/video enable telemedicine. I verified patient identity with two identifiers, discussed telehealth limitations, and patient agreed to proceed. Patient Location:: home Provider Location:: office Pre-visit prep was completed: yes AWV questionnaire completed by patient prior to visit?: yes Date:: 09/17/24 Living arrangements:: (Patient-Rptd) lives with spouse/significant other Patient's Overall Health Status Rating: (Patient-Rptd) very good Typical amount of pain: (Patient-Rptd) some Does pain affect daily life?: (Patient-Rptd) no Are you currently prescribed opioids?: no  Dietary Habits and Nutritional Risks How many meals a day?: (Patient-Rptd) 3 Eats fruit and vegetables daily?: (!) (Patient-Rptd) no Most meals are obtained by: (Patient-Rptd) eating out In the last 2 weeks, have you had any of the following?: none Diabetic:: no  Functional Status Activities of Daily Living (to include ambulation/medication): (Patient-Rptd) Independent Ambulation: (Patient-Rptd) Independent Medication Administration: (Patient-Rptd) Independent Home Management (perform basic housework or laundry): (Patient-Rptd) Independent Manage your own finances?: (Patient-Rptd) yes Primary transportation is: (Patient-Rptd) driving Concerns about  vision?: no *vision screening is required for WTM* (last 8/25) Concerns about hearing?: no  Fall Screening Falls in the past year?: (Patient-Rptd) 0 Number of falls in past year: 0 Was there an injury with Fall?: 0 Fall Risk Category Calculator: 0 Patient Fall Risk Level: Low Fall Risk  Fall Risk Patient at Risk for Falls Due to: No Fall Risks Fall risk Follow up: Falls evaluation completed; Education provided  Home and Transportation Safety: All rugs have non-skid backing?: (!) (Patient-Rptd) no All stairs or steps have railings?: (Patient-Rptd) yes Grab bars in the bathtub or shower?: (!) (Patient-Rptd) no Have non-skid surface in bathtub or shower?: (Patient-Rptd) yes Good home lighting?: (Patient-Rptd) yes Regular seat belt use?: (Patient-Rptd) yes Hospital stays in the last year:: (Patient-Rptd) no  Cognitive Assessment Difficulty concentrating, remembering, or making decisions? : (Patient-Rptd) no Will 6CIT or Mini Cog be Completed: yes What year is it?: 0 points What month is it?: 0 points Give patient an address phrase to remember (5 components): [address] [place] About what time is it?: 0 points Count backwards from 20 to 1: 0 points Say the months of the year in reverse: 0 points Repeat the address phrase from earlier: 0 points 6 CIT Score: 0 points  Advance Directives (For Healthcare) Does Patient Have a Medical Advance Directive?: No Would patient like information on creating a medical advance directive?: No - Patient declined  Reviewed/Updated  Reviewed/Updated: Medical History; Surgical History; Family History; Medications; Reviewed All (Medical, Surgical, Family, Medications, Allergies, Care Teams, Patient Goals); Allergies; Care Teams; Patient Goals    Allergies (verified) Latex   Current Medications (verified) Outpatient Encounter Medications as of 09/19/2024  Medication Sig   Alpha-Lipoic Acid 600 MG TABS Take 600 mg by mouth daily. For  neuropathy of feet   atorvastatin  (LIPITOR) 10 MG tablet TAKE ONE TABLET BY MOUTH DAILY.   azelastine  (ASTELIN ) 0.1 % nasal spray Place 1 spray into  both nostrils 2 (two) times daily.   celecoxib  (CELEBREX ) 100 MG capsule Take 1 capsule (100 mg total) by mouth daily as needed for moderate pain (pain score 4-6) (take with food).   ezetimibe  (ZETIA ) 10 MG tablet Take 1 tablet (10 mg total) by mouth daily.   finasteride  (PROSCAR ) 5 MG tablet TAKE ONE TABLET BY MOUTH DAILY.   fluorouracil  (EFUDEX ) 5 % cream Apply topically 2 (two) times daily. X4 weeks on scalp   fluticasone  (FLONASE ) 50 MCG/ACT nasal spray Place 2 sprays into both nostrils daily.   loratadine (CLARITIN) 10 MG tablet Take 10 mg by mouth daily.   mirtazapine  (REMERON ) 7.5 MG tablet Take 1 tablet (7.5 mg total) by mouth at bedtime. (Patient taking differently: Take 7.5 mg by mouth at bedtime. PRN)   omeprazole  (PRILOSEC) 40 MG capsule Take 1 capsule (40 mg total) by mouth daily.   tamsulosin  (FLOMAX ) 0.4 MG CAPS capsule TAKE ONE CAPSULE BY MOUTH DAILY.   triamcinolone  cream (KENALOG ) 0.1 % Apply 1 Application topically 2 (two) times daily. x10 days (if needed for allergic rash)   No facility-administered encounter medications on file as of 09/19/2024.    History: Past Medical History:  Diagnosis Date   Allergy    seasonal   Anxiety    BPH (benign prostatic hypertrophy)    Cataract 2008   GERD (gastroesophageal reflux disease)    Hyperlipidemia    Past Surgical History:  Procedure Laterality Date   ESOPHAGEAL DILATION  08/05/2024   Procedure: DILATION, ESOPHAGUS;  Surgeon: Cinderella Deatrice FALCON, MD;  Location: [facility];  Service: Endoscopy;;   ESOPHAGOGASTRODUODENOSCOPY N/A 08/05/2024   Procedure: EGD (ESOPHAGOGASTRODUODENOSCOPY);  Surgeon: Cinderella Deatrice FALCON, MD;  Location: [facility];  Service: Endoscopy;  Laterality: N/A;  1:00pm, ASA 1-2   EYE SURGERY  06/2019   Cornea Scraping in left eye.   POLYPECTOMY   08/05/2024   Procedure: POLYPECTOMY, INTESTINE;  Surgeon: Cinderella Deatrice FALCON, MD;  Location: [facility];  Service: Endoscopy;;   Family History  Problem Relation Age of Onset   Hypertension Mother    Vision loss Mother    Atrial fibrillation Mother    Heart disease Father    Thyroid  disease Father    Hypertension Sister    Atrial fibrillation Sister    Birth defects Daughter    Intellectual disability Daughter    Social History   Occupational History   Not on file  Tobacco Use   Smoking status: Never   Smokeless tobacco: Never  Vaping Use   Vaping status: Never Used  Substance and Sexual Activity   Alcohol use: Never   Drug use: Never   Sexual activity: Never   Tobacco Counseling Counseling given: Yes  SDOH Screenings   Food Insecurity: No Food Insecurity (09/19/2024)  Housing: Low Risk (09/19/2024)  Transportation Needs: No Transportation Needs (09/19/2024)  Utilities: Not At Risk (09/19/2024)  Alcohol Screen: Low Risk (09/17/2023)  Depression (PHQ2-9): Low Risk (09/19/2024)  Financial Resource Strain: Low Risk (06/20/2024)  Physical Activity: Insufficiently Active (09/19/2024)  Social Connections: Socially Integrated (09/19/2024)  Stress: No Stress Concern Present (06/20/2024)  Tobacco Use: Low Risk (09/19/2024)  Health Literacy: Adequate Health Literacy (09/19/2024)   See flowsheets for full screening details  Depression Screen PHQ 2 & 9 Depression Scale- Over the past 2 weeks, how often have you been bothered by any of the following problems? Little interest or pleasure in doing things: 0 Feeling down, depressed, or hopeless (PHQ Adolescent also includes...irritable): 0  PHQ-2 Total Score: 0 Trouble falling or staying asleep, or sleeping too much: 3 Feeling tired or having little energy: 2 Poor appetite or overeating (PHQ Adolescent also includes...weight loss): 0 Feeling bad about yourself - or that you are a failure or have let yourself or your family down:  0 Trouble concentrating on things, such as reading the newspaper or watching television (PHQ Adolescent also includes...like school work): 0 Moving or speaking so slowly that other people could have noticed. Or the opposite - being so fidgety or restless that you have been moving around a lot more than usual: 0 Thoughts that you would be better off dead, or of hurting yourself in some way: 0 PHQ-9 Total Score: 5 If you checked off any problems, how difficult have these problems made it for you to do your work, take care of things at home, or get along with other people?: Somewhat difficult     Goals Addressed             This Visit's Progress    Remain active and independent               Objective:    Today's Vitals   09/19/24 0852  BP: 135/82  Pulse: 75  Weight: 154 lb (69.9 kg)  Height: 5' 4 (1.626 m)   Body mass index is 26.43 kg/m.  Hearing/Vision screen No results found. Immunizations and Health Maintenance Health Maintenance  Topic Date Due   COVID-19 Vaccine (5 - Moderna risk 2025-26 season) 01/11/2025   Medicare Annual Wellness (AWV)  09/19/2025   DTaP/Tdap/Td (2 - Td or Tdap) 05/23/2034   Pneumococcal Vaccine: 50+ Years  Completed   Influenza Vaccine  Completed   Hepatitis C Screening  Completed   Zoster Vaccines- Shingrix   Completed   Meningococcal B Vaccine  Aged Out        Assessment/Plan:  This is a routine wellness examination for Raymond Morales.  Patient Care Team: Jolinda Norene HERO, DO as PCP - General (Family Medicine)  I have personally reviewed and noted the following in the patients chart:   Medical and social history Use of alcohol, tobacco or illicit drugs  Current medications and supplements including opioid prescriptions. Functional ability and status Nutritional status Physical activity Advanced directives List of other physicians Hospitalizations, surgeries, and ER visits in previous 12 months Vitals Screenings to include  cognitive, depression, and falls Referrals and appointments  No orders of the defined types were placed in this encounter.  In addition, I have reviewed and discussed with patient certain preventive protocols, quality metrics, and best practice recommendations. A written personalized care plan for preventive services as well as general preventive health recommendations were provided to patient.   Raymond Morales, CMA   09/19/2024   Return in 1 year (on 09/19/2025).  After Visit Summary: (MyChart) Due to this being a telephonic visit, the after visit summary with patients personalized plan was offered to patient via MyChart   Nurse Notes: n/a "

## 2024-09-20 ENCOUNTER — Ambulatory Visit

## 2024-11-21 ENCOUNTER — Ambulatory Visit: Payer: Self-pay | Admitting: Family Medicine

## 2025-05-24 ENCOUNTER — Encounter: Payer: Self-pay | Admitting: Family Medicine

## 2025-09-20 ENCOUNTER — Ambulatory Visit
